# Patient Record
Sex: Male | Born: 1995
Health system: Southern US, Community
[De-identification: ages and names within clinical notes are randomized; demographics above are authoritative.]

---
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
## 2013-08-18 ENCOUNTER — Ambulatory Visit (INDEPENDENT_AMBULATORY_CARE_PROVIDER_SITE_OTHER): Payer: Federal, State, Local not specified - PPO | Admitting: Internal Medicine

## 2013-08-18 VITALS — BP 124/76 | HR 94 | Temp 98.5°F | Resp 17 | Ht 63.5 in | Wt 155.0 lb

## 2013-08-18 DIAGNOSIS — L259 Unspecified contact dermatitis, unspecified cause: Secondary | ICD-10-CM

## 2013-08-18 DIAGNOSIS — R51 Headache: Secondary | ICD-10-CM

## 2013-08-18 MED ORDER — BUTALBITAL-APAP-CAFFEINE 50-325-40 MG PO TABS: 1.0000 | ORAL_TABLET | Freq: Four times a day (QID) | ORAL | Status: DC | PRN

## 2013-08-18 MED ORDER — MELOXICAM 15 MG PO TABS: 15.0000 mg | ORAL_TABLET | Freq: Every day | ORAL | Status: DC

## 2013-08-18 MED ORDER — FLUOCINONIDE 0.05 % EX CREA: 1.0000 "application " | TOPICAL_CREAM | Freq: Two times a day (BID) | CUTANEOUS | Status: DC

## 2013-08-18 NOTE — Progress Notes (Signed)
Subjective:    Patient ID: Adam BanisterHarrison C Amis, male    DOB: 30-Sep-1995, 18 y.o.   MRN: [id number]  HPI Patient presents with a one day history of headache described as a low pitched rumbling, going from the back of his head into his neck, worsened with noise and motion and pressure behind his eyes worse with movement and light. He reports that yesterday he helped his Mom with errands and was not able to eat breakfast and the headache started soon afterwards.  He reports he usually eats a large breakfast. Headache has continued but did not interfere with sleep. He was able to go out with friends last night but felt a worsening of the headache sitting in a movie. He denies any recent illnesses, previous occurences of this type of headache, family history of migraines.  He denies chills, sweats, double vision, eye discharge, vision changes, sinus pain/pressure, nausea, vomiting, cough.  He reports mild nasal drainage, congestion, post nasal drip.     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   Additionally, he reports a rash to the abdomen that he had previously that was treated with a cream and has now returned.  He denies itching.  He reports using the cream for about one week after the rash resolved.     Review of Systems  Constitutional: Negative for fever, chills, activity change, appetite change and fatigue.  HENT: Positive for congestion, postnasal drip and rhinorrhea. Negative for ear pain, hearing loss, sinus pressure, sneezing, sore throat and tinnitus.   Eyes: Positive for photophobia and pain (pressure deep behind bilateral eyes). Negative for discharge, redness, itching and visual disturbance.  Respiratory: Negative for cough and shortness of breath.   Gastrointestinal: Negative for nausea, vomiting and diarrhea.  Musculoskeletal:  Negative for neck pain and neck stiffness.  Skin: Positive for rash (abdomen for about one week).  Neurological: Positive for headaches (occiput into the neck). Negative for dizziness and light-headedness.      Objective:   Physical Exam  Constitutional: He is oriented to person, place, and time. He appears well-developed and well-nourished. No distress.  HENT:  Head: Normocephalic and atraumatic.  Right Ear: Tympanic membrane and external ear normal. No decreased hearing is noted.  Left Ear: Tympanic membrane and external ear normal. No decreased hearing is noted.  Nose: Nose normal. Right sinus exhibits no maxillary sinus tenderness and no frontal sinus tenderness. Left sinus exhibits no maxillary sinus tenderness and no frontal sinus tenderness.  Mouth/Throat: Oropharynx is clear and moist.  Eyes: Conjunctivae and EOM are normal. Pupils are equal, round, and reactive to light. Right eye exhibits no exudate. Left eye exhibits no exudate. No scleral icterus.  Light sensitivity  Neck: Normal range of motion. Neck supple. No muscular tenderness present. No thyromegaly present.  Cardiovascular: Normal rate, regular rhythm and normal heart sounds.   No murmur heard. Pulmonary/Chest: Effort normal and breath sounds normal. No respiratory distress.  Musculoskeletal: He exhibits no edema.  Lymphadenopathy:       Head (right side): No submandibular, no tonsillar, no preauricular, no posterior auricular and no occipital adenopathy present.       Head (left side): No submandibular, no tonsillar, no preauricular, no posterior auricular and no occipital adenopathy present.    He has no cervical adenopathy.  Neurological: He is alert and  oriented to person, place, and time. He has normal reflexes. No cranial nerve deficit. Coordination normal.  Skin: Skin is warm and dry.     Rash previously treated with nystatin/triamcinolone  Psychiatric: He has a normal mood and affect. His behavior is normal.  Thought content normal.      Assessment & Plan:   I have completed the patient encounter in its entirety as documented by the FNP-S, with editing by me where necessary. Samrat Hayward P. Merla Riches, M.D.  Headache(784.0)--unclear etiology-? Early viral. /? Muscle contraction headache  Contact dermatitis-belt buckle  Meds ordered this encounter  Medications  . fluocinonide cream (LIDEX) 0.05 %    Sig: Apply 1 application topically 2 (two) times daily.    Dispense:  30 g    Refill:  0  . meloxicam (MOBIC) 15 MG tablet    Sig: Take 1 tablet (15 mg total) by mouth daily.    Dispense:  10 tablet    Refill:  0  . butalbital-acetaminophen-caffeine (FIORICET) 50-325-40 MG per tablet    Sig: Take 1-2 tablets by mouth every 6 (six) hours as needed for headache.    Dispense:  15 tablet    Refill:  0   F/u 1 week if not well/or f-u at [facility]

## 2014-01-10 ENCOUNTER — Ambulatory Visit (INDEPENDENT_AMBULATORY_CARE_PROVIDER_SITE_OTHER): Payer: Federal, State, Local not specified - PPO | Admitting: Family Medicine

## 2014-01-10 VITALS — BP 116/72 | HR 91 | Temp 98.3°F | Resp 16 | Ht 63.75 in | Wt 167.2 lb

## 2014-01-10 DIAGNOSIS — R040 Epistaxis: Secondary | ICD-10-CM

## 2014-01-10 LAB — POCT CBC
Granulocyte percent: 58.3 %G (ref 37–80)
HEMATOCRIT: 45.5 % (ref 43.5–53.7)
HEMOGLOBIN: 15 g/dL (ref 14.1–18.1)
Lymph, poc: 2.3 (ref 0.6–3.4)
MCH, POC: 28.7 pg (ref 27–31.2)
MCHC: 33.1 g/dL (ref 31.8–35.4)
MCV: 86.6 fL (ref 80–97)
MID (cbc): 0.4 (ref 0–0.9)
MPV: 7.7 fL (ref 0–99.8)
POC GRANULOCYTE: 3.7 (ref 2–6.9)
POC LYMPH PERCENT: 36.1 %L (ref 10–50)
POC MID %: 5.6 % (ref 0–12)
Platelet Count, POC: 293 10*3/uL (ref 142–424)
RBC: 5.25 M/uL (ref 4.69–6.13)
RDW, POC: 12.8 %
WBC: 6.4 10*3/uL (ref 4.6–10.2)

## 2014-01-10 MED ORDER — MAGIC MOUTHWASH W/LIDOCAINE: 10.0000 mL | ORAL | Status: DC | PRN

## 2014-01-10 MED ORDER — OXYMETAZOLINE HCL 0.05 % NA SOLN: 3.0000 | NASAL | Status: DC | PRN

## 2014-01-10 NOTE — Patient Instructions (Signed)
Nosebleed Nosebleeds can be caused by many conditions, including trauma, infections, polyps, foreign bodies, dry mucous membranes or climate, medicines, and air conditioning. Most nosebleeds occur in the front of the nose. Because of this location, most nosebleeds can be controlled by pinching the nostrils gently and continuously for at least 10 to 20 minutes. The long, continuous pressure allows enough time for the blood to clot. If pressure is released during that 10 to 20 minute time period, the process may have to be started again. The nosebleed may stop by itself or quit with pressure, or it may need concentrated heating (cautery) or pressure from packing. HOME CARE INSTRUCTIONS   If your nose was packed, try to maintain the pack inside until your health care provider removes it. If a gauze pack was used and it starts to fall out, gently replace it or cut the end off. Do not cut if a balloon catheter was used to pack the nose. Otherwise, do not remove unless instructed.  Avoid blowing your nose for 12 hours after treatment. This could dislodge the pack or clot and start the bleeding again.  If the bleeding starts again, sit up and bend forward, gently pinching the front half of your nose continuously for 20 minutes.  If bleeding was caused by dry mucous membranes, use over-the-counter saline nasal spray or gel. This will keep the mucous membranes moist and allow them to heal. If you must use a lubricant, choose the water-soluble variety. Use it only sparingly and not within several hours of lying down.  Do not use petroleum jelly or mineral oil, as these may drip into the lungs and cause serious problems.  Maintain humidity in your home by using less air conditioning or by using a humidifier.  Do not use aspirin or medicines which make bleeding more likely. Your health care provider can give you recommendations on this.  Resume normal activities as you are able, but try to avoid straining,  lifting, or bending at the waist for several days.  If the nosebleeds become recurrent and the cause is unknown, your health care provider may suggest laboratory tests. SEEK MEDICAL CARE IF: You have a fever. SEEK IMMEDIATE MEDICAL CARE IF:   Bleeding recurs and cannot be controlled.  There is unusual bleeding from or bruising on other parts of the body.  Nosebleeds continue.  There is any worsening of the condition which originally brought you in.  You become light-headed, feel faint, become sweaty, or vomit blood. MAKE SURE YOU:   Understand these instructions.  Will watch your condition.  Will get help right away if you are not doing well or get worse. Document Released: 03/04/2005 Document Revised: 10/09/2013 Document Reviewed: 04/25/2009 [facility] Patient Information 2015 [place], [place]. This information is not intended to replace advice given to you by your health care provider. Make sure you discuss any questions you have with your health care provider.  Swallowed Foreign Body, Adult You have swallowed an object (foreign body). Once the foreign body has passed through the food tube (esophagus), which leads from the mouth to the stomach, it will usually continue through the body without problems. This is because the point where the esophagus enters into the stomach is the narrowest place through which the foreign body must pass. Sometimes the foreign body gets stuck. The most common type of foreign body obstruction in adults is food impaction. Many times, bones from fish or meat products may become lodged in the esophagus or injure the throat on  the way down. When there is an object that obstructs the esophagus, the most obvious symptoms are pain and the inability to swallow normally. In some cases, foreign bodies that can be life threatening are swallowed. Examples of these are certain medications and illicit drugs. Often in these instances, patients are afraid of telling what they  swallowed. However, it is extremely important to tell the emergency caregiver what was swallowed because life-saving treatment may be needed.  X-ray exams may be taken to find the location of the foreign body. However, some objects do not show up well or may be too small to be seen on an X-ray image. If the foreign body is too large or too sharp, it may be too dangerous to allow it to pass on its own. You may need to see a caregiver who specializes in the digestive system (gastroenterologist). In a few cases, a specialist may need to remove the object using a method called "endoscopy". This involves passing a thin, soft, flexible tube into the food pipe to locate and remove the object. Follow up with your primary doctor or the referral you were given by the emergency caregiver. HOME CARE INSTRUCTIONS   If your caregiver says it is safe for you to eat, then only have liquids and soft foods until your symptoms improve.  Once you are eating normally:  Cut food into small pieces.  Remove small bones from food.  Remove large seeds and pits from fruit.  Chew your food well.  Do not talk, laugh, or engage in physical activity while eating or swallowing. SEEK MEDICAL CARE IF:  You develop worsening shortness of breath, uncontrollable coughing, chest pains or high fever, greater than 102 F (38.9 C).  You are unable to eat or drink or you feel that food is getting stuck in your throat.  You have choking symptoms or cannot stop drooling.  You develop abdominal pain, vomiting (especially of blood), or rectal bleeding. MAKE SURE YOU:   Understand these instructions.  Will watch your condition.  Will get help right away if you are not doing well or get worse. Document Released: 11/12/2009 Document Revised: 08/17/2011 Document Reviewed: 11/12/2009 [facility] Patient Information 2015 [place], [place]. This information is not intended to replace advice given to you by your health care provider. Make  sure you discuss any questions you have with your health care provider.

## 2014-01-10 NOTE — Progress Notes (Signed)
This chart was scribed for Norberto SorensonEva Shaw, MD by Luisa DagoPriscilla Tutu, ED Scribe. This patient was seen in room 11 and the patient's care was started at 9:10 PM.  Subjective:    Patient ID: Adam BanisterHarrison C Slight, male    DOB: 06/30/1995, 18 y.o.   MRN: [id number] Chief Complaint  Patient presents with  . Epistaxis    for about 3 hours this morning.  made him feel nauseated today.  sharp pain in his throat now and can feel this in his right ear.  pt also stated that about 3 wks ago he was having chest pain sometimes with heartburn.      HPI Adam Murphy is a 18 y.o. male coming into the office with his mother complaining of a severe episode of epistaxis that lasted for approximately 3 hours. Pt states that he tried to stop the blood by applying multiple paper towels into his left nostril but whenever he took the tissue out the blood "gushed out". He states that his last episode of epistaxis was in March during a short illness. Adam Murphy also experienced some associated nausea and throat pain after drinking a smoothie. He states that the pain in his throat is causing him to experienced some mild hoarseness. Pt states that the pain is more localized to the right side of his throat. He describes the pain as "sharp" in nature. Denies any congestion, fever, chills, or usage of nasal sprays.  Pt states that 2 weeks ago he had an episode or indigestion. So he is concerned that this episode of sore throat may be due to the indigestion. He states that he experienced associated chest tightness. However, during that time there was a lot of family issues going on secondary to a death in the family. He denies taking any medication. Denies any metallic taste in his mouth upon waking. However, he does state that usually upon waking his throat feels really dry and he has to drink a lot of water in order to alleviate that dryness.   There are no active problems to display for this patient.  No past medical history on  file. No past surgical history on file. Allergies  Allergen Reactions  . Penicillins     hives   Prior to Admission medications   Not on File   History   Social History  . Marital Status: Single    Spouse Name: N/A    Number of Children: N/A  . Years of Education: N/A   Occupational History  . Not on file.   Social History Main Topics  . Smoking status: Never Smoker   . Smokeless tobacco: Not on file  . Alcohol Use: No  . Drug Use: No  . Sexual Activity: No   Other Topics Concern  . Not on file   Social History Narrative  . No narrative on file    Review of Systems  Constitutional: Negative for fatigue and unexpected weight change.  HENT: Positive for nosebleeds. Negative for congestion.   Eyes: Negative for visual disturbance.  Respiratory: Negative for cough, chest tightness and shortness of breath.   Cardiovascular: Negative for chest pain, palpitations and leg swelling.  Gastrointestinal: Positive for nausea. Negative for abdominal pain and blood in stool.  Neurological: Negative for dizziness, light-headedness and headaches.    Vital Signs: BP 116/72  Pulse 91  Temp(Src) 98.3 F (36.8 C) (Oral)  Resp 16  Ht 5' 3.75" (1.619 m)  Wt 167 lb 3.2 oz (75.841 kg)  BMI 28.93 kg/m2  SpO2 99%  Objective:   Physical Exam  Nursing note and vitals reviewed. Constitutional: He is oriented to person, place, and time. He appears well-developed and well-nourished. No distress.  HENT:  Head: Normocephalic and atraumatic.  Nose: Nasal septal hematoma present.  Mild nasal congestion noted.  Eyes: Conjunctivae and EOM are normal. Pupils are equal, round, and reactive to light.  Neck: Normal range of motion. Neck supple. No thyromegaly present.  Cardiovascular: Normal rate.   Pulmonary/Chest: Effort normal. No respiratory distress.  Musculoskeletal: Normal range of motion.  Lymphadenopathy:    He has no cervical adenopathy.    He has no axillary adenopathy.   Neurological: He is alert and oriented to person, place, and time.  Skin: Skin is warm and dry.  Psychiatric: He has a normal mood and affect. His behavior is normal.      Results for orders placed in visit on 01/10/14  POCT CBC      Result Value Ref Range   WBC 6.4  4.6 - 10.2 K/uL   Lymph, poc 2.3  0.6 - 3.4   POC LYMPH PERCENT 36.1  10 - 50 %L   MID (cbc) 0.4  0 - 0.9   POC MID % 5.6  0 - 12 %M   POC Granulocyte 3.7  2 - 6.9   Granulocyte percent 58.3  37 - 80 %G   RBC 5.25  4.69 - 6.13 M/uL   Hemoglobin 15.0  14.1 - 18.1 g/dL   HCT, POC 57.8  46.9 - 53.7 %   MCV 86.6  80 - 97 fL   MCH, POC 28.7  27 - 31.2 pg   MCHC 33.1  31.8 - 35.4 g/dL   RDW, POC 62.9     Platelet Count, POC 293  142 - 424 K/uL   MPV 7.7  0 - 99.8 fL     Assessment & Plan:   Epistaxis - Plan: POCT CBC  Meds ordered this encounter  Medications  . oxymetazoline (AFRIN NASAL SPRAY) 0.05 % nasal spray    Sig: Place 3 sprays into left nostril every 5 (five) minutes as needed for congestion.    Dispense:  30 mL    Refill:  0  . Alum & Mag Hydroxide-Simeth (MAGIC MOUTHWASH W/LIDOCAINE) SOLN    Sig: Take 10 mLs by mouth every 2 (two) hours as needed for mouth pain. Swish in mouth and swallow    Dispense:  360 mL    Refill:  0    Ok to use pharmacy formulary and mix 1:1 with viscous lidocaine    I personally performed the services described in this documentation, which was scribed in my presence. The recorded information has been reviewed and considered, and addended by me as needed.  Norberto Sorenson, MD MPH

## 2014-01-12 ENCOUNTER — Encounter (HOSPITAL_COMMUNITY): Payer: Self-pay | Admitting: Emergency Medicine

## 2014-01-12 ENCOUNTER — Emergency Department (HOSPITAL_COMMUNITY)
Admission: EM | Admit: 2014-01-12 | Discharge: 2014-01-12 | Disposition: A | Discharge status: Home / Self Care | Payer: Federal, State, Local not specified - PPO | Attending: Emergency Medicine | Admitting: Emergency Medicine

## 2014-01-12 DIAGNOSIS — Z880 Allergy status to penicillin: Secondary | ICD-10-CM | POA: Insufficient documentation

## 2014-01-12 DIAGNOSIS — R209 Unspecified disturbances of skin sensation: Secondary | ICD-10-CM | POA: Insufficient documentation

## 2014-01-12 DIAGNOSIS — R6889 Other general symptoms and signs: Secondary | ICD-10-CM | POA: Insufficient documentation

## 2014-01-12 DIAGNOSIS — T50905A Adverse effect of unspecified drugs, medicaments and biological substances, initial encounter: Secondary | ICD-10-CM

## 2014-01-12 DIAGNOSIS — T413X5A Adverse effect of local anesthetics, initial encounter: Secondary | ICD-10-CM | POA: Insufficient documentation

## 2014-01-12 NOTE — ED Notes (Signed)
Patient here with complaint of allergic reaction to medication prescribed at urgent care. States that he was having some pain in his throat which prompted him to go to urgent care. States his throat began to feel numb, started to burn, then it became harder to breath. States that symptoms have resolved some at this time. Inspection of throat reveals blood shot inflamed tissue, but airway doesn't appear to be swelling. Patient denies shortness of breath.

## 2014-01-12 NOTE — ED Provider Notes (Signed)
CSN: [id number]     Arrival date & time 01/12/14  0101 History   First MD Initiated Contact with Patient 01/12/14 0325     Chief Complaint  Patient presents with  . Allergic Reaction     (Consider location/radiation/quality/duration/timing/severity/associated sxs/prior Treatment) HPI 18 year old male had gone to an urgent care just PTA after drinking a smoothie and began feeling a scratchy throat, the urgent care prescribed viscous lidocaine and when the patient took it tonight at home prior to arrival the patient felt numbness to his tongue and numbness to his throat and had no idea the medication was going to make him, so he thought it was an allergic reaction and came to the ED for evaluation, his symptoms are now resolved. He does not feel the need to use viscous lidocaine again.  OP minimal erythema  History reviewed. No pertinent past medical history. History reviewed. No pertinent past surgical history. Family History  Problem Relation Age of Onset  . Diabetes Paternal Grandmother    History  Substance Use Topics  . Smoking status: Never Smoker   . Smokeless tobacco: Not on file  . Alcohol Use: No    Review of Systems 10 Systems reviewed and are negative for acute change except as noted in the HPI.   Allergies  Penicillins  Home Medications   Prior to Admission medications   Medication Sig Start Date End Date Taking? Authorizing Provider  Alum & Mag Hydroxide-Simeth (MAGIC MOUTHWASH W/LIDOCAINE) SOLN Take 10 mLs by mouth every 2 (two) hours as needed for mouth pain. Swish in mouth and swallow 01/10/14  Yes Sherren MochaEva N Shaw, MD   BP 118/74  Pulse 92  Temp(Src) 98.4 F (36.9 C) (Oral)  Resp 16  SpO2 100% Physical Exam  Nursing note and vitals reviewed. Constitutional:  Awake, alert, nontoxic appearance.  HENT:  Head: Atraumatic.  Mouth/Throat: No oropharyngeal exudate.  Minimal erythema posterior pharynx no swelling; uvula midline; no trismus  Eyes: Right eye  exhibits no discharge. Left eye exhibits no discharge.  Neck: Neck supple.  Cardiovascular: Normal rate and regular rhythm.   No murmur heard. Pulmonary/Chest: Effort normal and breath sounds normal. No respiratory distress. He has no wheezes. He has no rales. He exhibits no tenderness.  RA sat normal 100%  Abdominal: Soft. There is no tenderness. There is no rebound.  Musculoskeletal: He exhibits no tenderness.  Baseline ROM, no obvious new focal weakness.  Lymphadenopathy:    He has no cervical adenopathy.  Neurological: He is alert.  Mental status and motor strength appears baseline for patient and situation.  Skin: No rash noted.  Psychiatric: He has a normal mood and affect.    ED Course  Procedures (including critical care time) Labs Review Labs Reviewed - No data to display  Imaging Review No results found.   EKG Interpretation None      MDM   Final diagnoses:  Medication side effect, initial encounter    Patient / Family / Caregiver informed of clinical course, understand medical decision-making process, and agree with plan.  I doubt any other EMC precluding discharge at this time including, but not necessarily limited to the following:allergic reaction.    Hurman HornJohn M Richa Shor, MD 01/13/14 [phone]

## 2014-01-12 NOTE — Discharge Instructions (Signed)
°  RETURN IMMEDIATELY IF you develop inability to swallow, drooling, tongue swelling, lip swelling, shortness of breath, confusion or altered mental status, a new rash, become dizzy, faint, or poorly responsive, or are unable to be cared for at home.

## 2014-12-19 ENCOUNTER — Ambulatory Visit
Admission: RE | Admit: 2014-12-19 | Discharge: 2014-12-19 | Disposition: A | Discharge status: Home / Self Care | Payer: Federal, State, Local not specified - PPO | Source: Ambulatory Visit | Attending: Physician Assistant | Admitting: Physician Assistant

## 2014-12-19 ENCOUNTER — Other Ambulatory Visit: Payer: Self-pay | Admitting: Physician Assistant

## 2014-12-19 DIAGNOSIS — R0989 Other specified symptoms and signs involving the circulatory and respiratory systems: Secondary | ICD-10-CM

## 2015-05-24 ENCOUNTER — Ambulatory Visit (INDEPENDENT_AMBULATORY_CARE_PROVIDER_SITE_OTHER): Payer: Federal, State, Local not specified - PPO | Admitting: Family Medicine

## 2015-05-24 VITALS — BP 104/70 | HR 89 | Temp 98.4°F | Resp 16 | Ht 64.75 in | Wt 172.0 lb

## 2015-05-24 DIAGNOSIS — H6982 Other specified disorders of Eustachian tube, left ear: Secondary | ICD-10-CM | POA: Diagnosis not present

## 2015-05-24 DIAGNOSIS — J0110 Acute frontal sinusitis, unspecified: Secondary | ICD-10-CM

## 2015-05-24 MED ORDER — CEFDINIR 300 MG PO CAPS: 600.0000 mg | ORAL_CAPSULE | Freq: Every day | ORAL | Status: DC

## 2015-05-24 NOTE — Progress Notes (Signed)
Patient ID: Adam Murphy, male    DOB: 1995-09-22  Age: 19 y.o. MRN: [id number]  Chief Complaint  Patient presents with  . Ear Pain    X 2 weeks, left side  . Sore Throat    X 2 weeks, left side  . Headache    X 2 weeks, left side    Subjective:   19 year old male who is here with history of having some symptoms over the past couple weeks. He has had some left-sided hand and frontal pain, stuffiness in his left ear with some discomfort down into the left side of the throat and neck. He has left without finishing up the semester at school, but now that is home decided to come on and get checked. He does have some Flonase, but he has not used it regularly. He does not smoke. He is otherwise a generally healthy person. Not on other regular medications. He has not been febrile to his knowledge.  He is a Consulting civil engineerstudent at [employer], Holiday representativeenior, Environmental managerphysics with math and sciences.    Current allergies, medications, problem list, past/family and social histories reviewed.  Objective:  BP 104/70 mmHg  Pulse 89  Temp(Src) 98.4 F (36.9 C) (Oral)  Resp 16  Ht 5' 4.75" (1.645 m)  Wt 172 lb (78.019 kg)  BMI 28.83 kg/m2  SpO2 97%  Healthy-appearing young man in no acute distress. He is TMs are dull, more so on the left with some exudate appearance on the left TM but the drum is not very red. He has throat is clear. Neck supple without major nodes. Chest is clear to auscultation. Heart regular without murmur. He has some tenderness over the left frontal and maxillary regions.  Assessment & Plan:   Assessment: 1. Acute frontal sinusitis, recurrence not specified   2. Eustachian tube dysfunction, left       Plan: This probably represents a low-grade sinusitis, with secondary eustachian tube dysfunction and maybe an early otitis. Will go ahead and treat, no labs indicated today. Return if not improving  No orders of the defined types were placed in this encounter.    Meds ordered  this encounter  Medications  . cefdinir (OMNICEF) 300 MG capsule    Sig: Take 2 capsules (600 mg total) by mouth daily.    Dispense:  20 capsule    Refill:  0         Patient Instructions  Drink plenty of fluids and get enough rest  Take Omnicef (cefdinir) 30 mg 1 pill twice daily  Use the Flonase (fluticasone) nose spray 2 sprays each nostril twice daily for about 3 or 4 days, then decrease to once daily  Take over-the-counter Claritin-D (loratadine D) to try and open up the sinuses and eustachian tubes  Return if not improving  Tylenol 500 mg 2 pills 3 times daily or ibuprofen 600-800 mg 3 times daily for headache and neck pain as needed.     Return if symptoms worsen or fail to improve.   HOPPER,DAVID, MD 05/24/2015

## 2015-05-24 NOTE — Patient Instructions (Signed)
Drink plenty of fluids and get enough rest  Take Omnicef (cefdinir) 30 mg 1 pill twice daily  Use the Flonase (fluticasone) nose spray 2 sprays each nostril twice daily for about 3 or 4 days, then decrease to once daily  Take over-the-counter Claritin-D (loratadine D) to try and open up the sinuses and eustachian tubes  Return if not improving  Tylenol 500 mg 2 pills 3 times daily or ibuprofen 600-800 mg 3 times daily for headache and neck pain as needed.

## 2015-10-26 ENCOUNTER — Ambulatory Visit: Payer: Federal, State, Local not specified - PPO | Admitting: Family Medicine

## 2015-10-26 VITALS — BP 104/62 | HR 80 | Temp 97.6°F | Resp 16 | Ht 64.0 in | Wt 174.2 lb

## 2015-10-26 DIAGNOSIS — R369 Urethral discharge, unspecified: Secondary | ICD-10-CM

## 2015-10-26 MED ORDER — AZITHROMYCIN 250 MG PO TABS: 1000.0000 mg | ORAL_TABLET | Freq: Once | ORAL | Status: DC

## 2015-10-26 MED ORDER — CEFTRIAXONE SODIUM 1 G IJ SOLR
250.0000 mg | Freq: Once | INTRAMUSCULAR | Status: AC
  Administered 2015-10-26: 250 mg via INTRAMUSCULAR

## 2015-10-26 NOTE — Progress Notes (Signed)
Adam Murphy is a 20 y.o. male who presents to Urgent Care today for penile discharge. Patient has a 2 to three-day history of penile discharge. This is without pain or irritation. It is not consistent with previous episodes of gonorrhea. Otherwise she feels well. He has sex with men. He practices safe sex and gets regular testing for HIV. His last test was less than one month ago. He uses condoms exclusively during sex.   History reviewed. No pertinent past medical history. History reviewed. No pertinent past surgical history. Social History  Substance Use Topics  . Smoking status: Never Smoker   . Smokeless tobacco: Not on file  . Alcohol Use: No   ROS as above Medications: Current Outpatient Prescriptions  Medication Sig Dispense Refill  . azithromycin (ZITHROMAX) 250 MG tablet Take 4 tablets (1,000 mg total) by mouth once. 1 6 each 0   Current Facility-Administered Medications  Medication Dose Route Frequency Provider Last Rate Last Dose  . cefTRIAXone (ROCEPHIN) injection 250 mg  250 mg Intramuscular Once Rodolph BongEvan S Glennda Weatherholtz, MD       Allergies  Allergen Reactions  . Penicillins     hives  . Nickel Rash     Exam:  BP 104/62 mmHg  Pulse 80  Temp(Src) 97.6 F (36.4 C) (Oral)  Resp 16  Ht 5\' 4"  (1.626 m)  Wt 174 lb 3.2 oz (79.017 kg)  BMI 29.89 kg/m2  SpO2 99% Gen: Well NAD HEENT: EOMI,  MMM Lungs: Normal work of breathing. CTABL Heart: RRR no MRG Abd: NABS, Soft. Nondistended, Nontender Exts: Brisk capillary refill, warm and well perfused.  Genitals: No inguinal lymphadenopathy. Penis is circumcised without significant discharge or lesions. Testicles distended bilaterally without masses or tenderness. MSK: Normal muscle motion and function throughout.  Ceftriaxone 250 mg given IM prior to discharge.  No results found for this or any previous visit (from the past 24 hour(s)). No results found.  Assessment and Plan: 20 y.o. male with penile discharge: Unclear  etiology. GC chlamydia pending. Treatment with ceftriaxone IM in clinic and a prescription for azithromycin provided. Continue routine HIV testing  Discussed warning signs or symptoms. Please see discharge instructions. Patient expresses understanding.

## 2015-10-26 NOTE — Patient Instructions (Addendum)
  Thank you for coming in today. Take the azithromycin pills today.  We will call you with results.    Dr Denyse Amassorey.   Address: [address], [place], [place] [address]   Phone: [phone]  Call and establish care for PrEP Return as needed.      IF you received an x-ray today, you will receive an invoice from [facility]. Please contact [facility] at [phone] with questions or concerns regarding your invoice.   IF you received labwork today, you will receive an invoice from [facility]. Please contact [facility] at [phone] with questions or concerns regarding your invoice.   Our billing staff will not be able to assist you with questions regarding bills from these companies.  You will be contacted with the lab results as soon as they are available. The fastest way to get your results is to activate your My Chart account. Instructions are located on the last page of this paperwork. If you have not heard from us regarding the results in 2 weeks, please contact this office.

## 2015-10-28 LAB — GC/CHLAMYDIA PROBE AMP
CT Probe RNA: NOT DETECTED
GC PROBE AMP APTIMA: NOT DETECTED

## 2015-12-26 ENCOUNTER — Other Ambulatory Visit: Payer: BLUE CROSS/BLUE SHIELD

## 2015-12-26 ENCOUNTER — Ambulatory Visit: Payer: Federal, State, Local not specified - PPO | Admitting: Internal Medicine

## 2015-12-26 ENCOUNTER — Ambulatory Visit (INDEPENDENT_AMBULATORY_CARE_PROVIDER_SITE_OTHER): Payer: BLUE CROSS/BLUE SHIELD | Admitting: Pharmacist Clinician (PhC)/ Clinical Pharmacy Specialist

## 2015-12-26 DIAGNOSIS — Z7251 High risk heterosexual behavior: Secondary | ICD-10-CM

## 2015-12-26 NOTE — Progress Notes (Signed)
Patient ID: Adam Murphy, male   DOB: 1995/09/19, 20 y.o.   MRN: [id number] HPI: Adam Murphy is a 20 y.o. male who was referred to [place] for HIV PreP.   Allergies: Allergies  Allergen Reactions  . Penicillins     hives  . Nickel Rash    Vitals:    Past Medical History: No past medical history on file.  Social History: Social History   Social History  . Marital Status: Single    Spouse Name: N/A  . Number of Children: N/A  . Years of Education: N/A   Social History Main Topics  . Smoking status: Never Smoker   . Smokeless tobacco: Not on file  . Alcohol Use: No  . Drug Use: No  . Sexual Activity: No   Other Topics Concern  . Not on file   Social History Narrative    Previous Regimen:   Current Regimen: None  Labs: No results found for: HIV1RNAQUANT, HIV1RNAVL, CD4TABS, HEPBSAB, HEPBSAG, HCVAB  CrCl: CrCl cannot be calculated (Unknown ideal weight.).  Lipids: No results found for: CHOL, TRIG, HDL, CHOLHDL, VLDL, LDLCALC  Assessment: Adam Murphy was added on to the schedule per Dr. Megan Salon to be seen for PreP. He presented here with his partner who is HIV positive and has been on therapy for the last yr and a half. Adam Murphy has had about 6 partners in the last 6 months. He would likely be qualified for the Prep study. Adam Murphy met with him to explain the process. He does travel for work sometimes but he may be able to manage around it for the study. His partner encourage him to enroll into the study. Since he is here we are going to get all baseline test today. Once the tests are back, he can decide to either starting PreP or enroll into the study.   Recommendations:  Baseline test for PreP (HIV, hep a/b/c, bmet, STDs)  Wilfred Lacy, PharmD Clinical Infectious [facility] 12/26/2015, 5:10 PM

## 2015-12-27 LAB — HEPATITIS B SURFACE ANTIBODY,QUALITATIVE: HEP B S AB: NEGATIVE

## 2015-12-27 LAB — BASIC METABOLIC PANEL
BUN: 10 mg/dL (ref 7–25)
CALCIUM: 9.1 mg/dL (ref 8.6–10.3)
CO2: 26 mmol/L (ref 20–31)
CREATININE: 1.17 mg/dL (ref 0.60–1.35)
Chloride: 105 mmol/L (ref 98–110)
Glucose, Bld: 85 mg/dL (ref 65–99)
Potassium: 4.4 mmol/L (ref 3.5–5.3)
Sodium: 141 mmol/L (ref 135–146)

## 2015-12-27 LAB — HEPATITIS A ANTIBODY, TOTAL: Hep A Total Ab: REACTIVE — AB

## 2015-12-27 LAB — RPR

## 2015-12-27 LAB — HEPATITIS B SURFACE ANTIGEN: HEP B S AG: NEGATIVE

## 2015-12-27 LAB — HEPATITIS C ANTIBODY: HCV Ab: NEGATIVE

## 2015-12-27 LAB — HIV ANTIBODY (ROUTINE TESTING W REFLEX): HIV 1&2 Ab, 4th Generation: NONREACTIVE

## 2015-12-30 LAB — URINE CYTOLOGY ANCILLARY ONLY
CHLAMYDIA, DNA PROBE: NEGATIVE
Neisseria Gonorrhea: NEGATIVE

## 2015-12-30 LAB — CYTOLOGY, (ORAL, ANAL, URETHRAL) ANCILLARY ONLY
Chlamydia: NEGATIVE
Chlamydia: NEGATIVE
NEISSERIA GONORRHEA: NEGATIVE
Neisseria Gonorrhea: NEGATIVE

## 2016-01-14 ENCOUNTER — Ambulatory Visit: Payer: Federal, State, Local not specified - PPO | Admitting: Internal Medicine

## 2016-01-20 ENCOUNTER — Telehealth: Payer: Self-pay | Admitting: Pharmacist Clinician (PhC)/ Clinical Pharmacy Specialist

## 2016-01-20 DIAGNOSIS — Z7251 High risk heterosexual behavior: Secondary | ICD-10-CM

## 2016-01-20 MED ORDER — EMTRICITABINE-TENOFOVIR DF 200-300 MG PO TABS: 1.0000 | ORAL_TABLET | Freq: Every day | ORAL | 0 refills | Status: DC

## 2016-01-20 MED FILL — TRUVADA 200-300 MG TABS: 200-300 | 30 days supply | Qty: 30 | Fill #0

## 2016-01-20 NOTE — Telephone Encounter (Signed)
Called Adam Murphy back to tell him that we are going to send is rx for 1 mo supply of TRV for PreP. [facility]. He is interviewing for a new job tomorrow and was wondering if he'd still be qualify for the study. Told him that he could still be enrolled into the study. Appts are made for HIV ab in 1 mo and f/u with pharmacy 1 week later for 3 mo supply.

## 2016-01-20 NOTE — Telephone Encounter (Signed)
Yes he would definitely qualify for the study. The only thing that would take him out would be if his risk changed by the time he rescreened for study. Being on Truvada BEFORE the study. He would have to stop it and only take study meds ONCE he goes iin the study

## 2016-01-20 NOTE — Telephone Encounter (Signed)
Error

## 2016-01-24 IMAGING — CR DG CHEST 2V
2 series · 2 of 2 positions shown · non-contrast
Comparison: None.

CLINICAL DATA: Chest congestion and shortness of breath for the
last month

EXAM:
CHEST  2 VIEW

[view not recorded (1 of 2)]
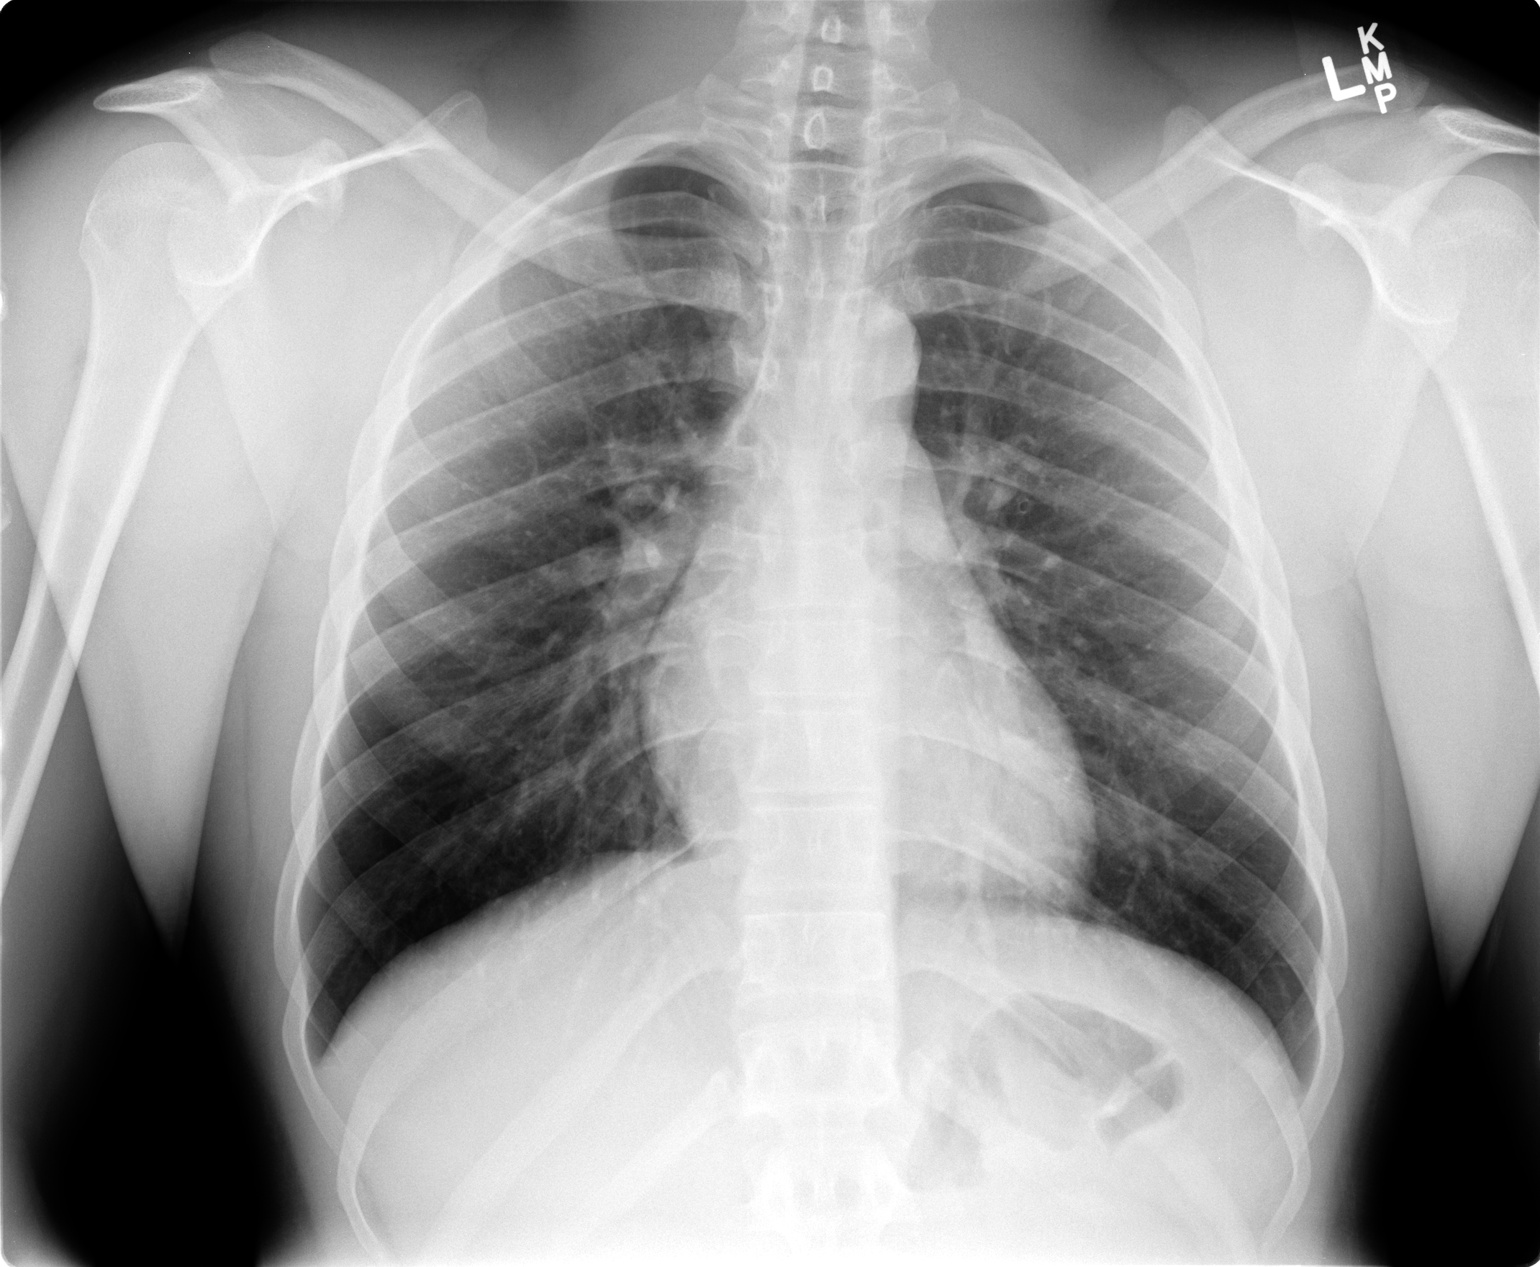

[view not recorded (2 of 2)]
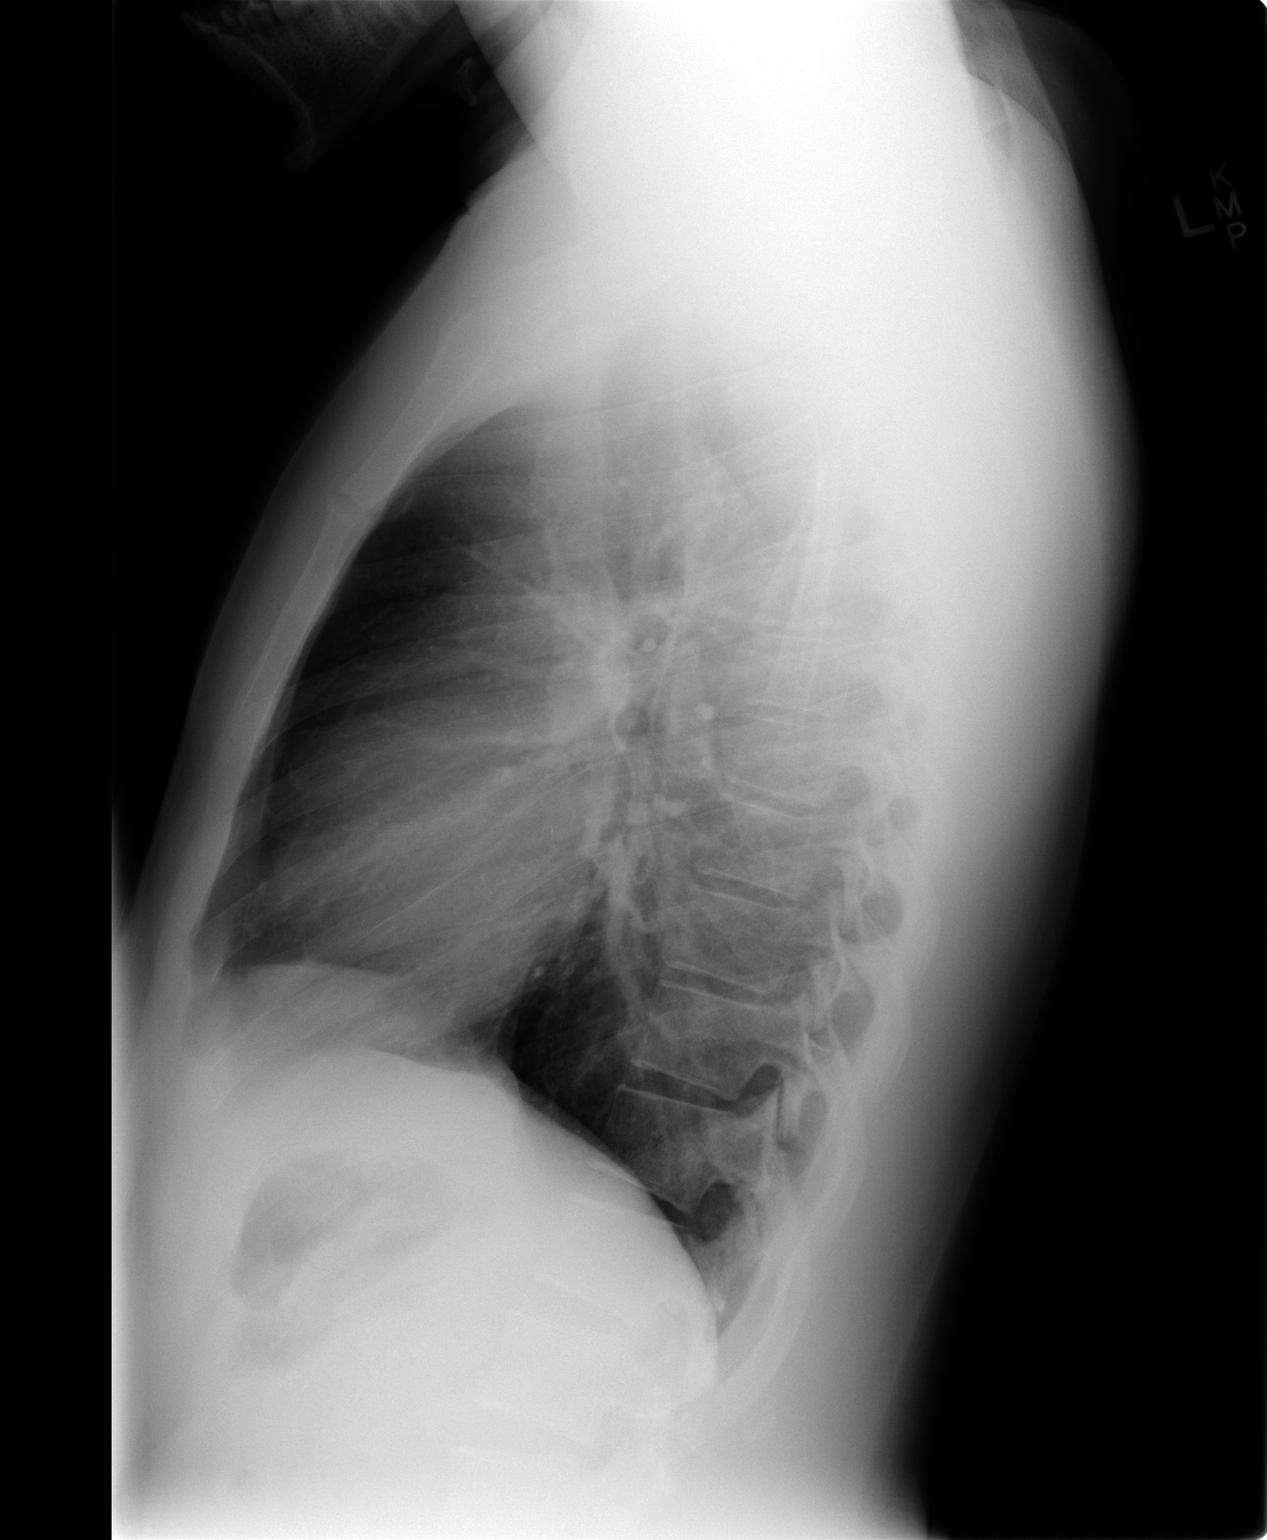

[2 of 2 positions shown; findings below may reference images not displayed]

FINDINGS: No active infiltrate or effusion is seen. Mediastinal and hilar
contours are unremarkable. The heart is within normal limits in
size. No bony abnormality is seen.
IMPRESSION: No active cardiopulmonary disease.

## 2016-02-13 ENCOUNTER — Other Ambulatory Visit: Payer: BLUE CROSS/BLUE SHIELD

## 2016-02-13 DIAGNOSIS — Z7251 High risk heterosexual behavior: Secondary | ICD-10-CM

## 2016-02-13 LAB — BASIC METABOLIC PANEL
BUN: 11 mg/dL (ref 7–25)
CHLORIDE: 102 mmol/L (ref 98–110)
CO2: 26 mmol/L (ref 20–31)
CREATININE: 1.02 mg/dL (ref 0.60–1.35)
Calcium: 9.3 mg/dL (ref 8.6–10.3)
Glucose, Bld: 124 mg/dL — ABNORMAL HIGH (ref 65–99)
Potassium: 4.5 mmol/L (ref 3.5–5.3)
Sodium: 138 mmol/L (ref 135–146)

## 2016-02-14 LAB — HIV ANTIBODY (ROUTINE TESTING W REFLEX): HIV 1&2 Ab, 4th Generation: NONREACTIVE

## 2016-02-17 ENCOUNTER — Ambulatory Visit: Payer: BLUE CROSS/BLUE SHIELD

## 2016-02-17 ENCOUNTER — Other Ambulatory Visit (HOSPITAL_COMMUNITY)
Admission: RE | Admit: 2016-02-17 | Discharge: 2016-02-17 | Disposition: A | Discharge status: Home / Self Care | Payer: Federal, State, Local not specified - PPO | Source: Ambulatory Visit | Attending: Infectious Disease | Admitting: Infectious Disease

## 2016-02-17 ENCOUNTER — Ambulatory Visit (INDEPENDENT_AMBULATORY_CARE_PROVIDER_SITE_OTHER): Payer: BLUE CROSS/BLUE SHIELD | Admitting: Pharmacist Clinician (PhC)/ Clinical Pharmacy Specialist

## 2016-02-17 DIAGNOSIS — Z7251 High risk heterosexual behavior: Secondary | ICD-10-CM | POA: Diagnosis not present

## 2016-02-17 DIAGNOSIS — Z113 Encounter for screening for infections with a predominantly sexual mode of transmission: Secondary | ICD-10-CM | POA: Diagnosis present

## 2016-02-17 MED ORDER — EMTRICITABINE-TENOFOVIR DF 200-300 MG PO TABS: 1.0000 | ORAL_TABLET | Freq: Every day | ORAL | 0 refills | Status: DC

## 2016-02-17 NOTE — Progress Notes (Signed)
Patient ID: Adam BanisterHarrison C Belkin, male   DOB: 06-Sep-1995, 20 y.o.   MRN: [id number] HPI: Adam Murphy is a 20 y.o. male who is here for his pharmacy PreP appt. He started on Truvada about a month ago.   Allergies: Allergies  Allergen Reactions  . Penicillins     hives  . Nickel Rash    Vitals:    Past Medical History: No past medical history on file.  Social History: Social History   Social History  . Marital status: Single    Spouse name: N/A  . Number of children: N/A  . Years of education: N/A   Social History Main Topics  . Smoking status: Never Smoker  . Smokeless tobacco: Not on file  . Alcohol use No  . Drug use: No  . Sexual activity: No   Other Topics Concern  . Not on file   Social History Narrative  . No narrative on file    Previous Regimen:   Current Regimen: Truvada  Labs: Hep B S Ab (no units)  Date Value  12/26/2015 NEG   Hepatitis B Surface Ag (no units)  Date Value  12/26/2015 NEGATIVE   HCV Ab (no units)  Date Value  12/26/2015 NEGATIVE    CrCl: CrCl cannot be calculated (Unknown ideal weight.).  Lipids: No results found for: CHOL, TRIG, HDL, CHOLHDL, VLDL, LDLCALC  Assessment: Romeo AppleHarrison has a new job now. I offered him to be in the study again but he said that the schedule is not stable yet so he will reconsider when it's more stable. Started on Truvada for PreP about a month ago. He is in a relationship with a HIV positive partner. They had about 30 sexual intercourse in the in the last month. He stated that the partner is suppressed on ART. He doesn't use condoms during these intercourses. However, he has also had 4 other intercourse outside of the relationship with different partners. He stated that he used condoms 100% of the time during these outside intercourses. He participate in both receptive and insertive. He has not missed doses in the last 30 days. I stressed the importance of being compliance in order for the drug to  work. His HIV test came back neg this past week. Scr wnl. Explained the risk of HIV again with the use of PreP. We are going to get rectal/oral/urinary GC and RPR today.   Recommendations:  Refill Truvada x 3 mo Come back in 3 mo for labs and STD testing  Clide CliffPham, Minh Quang, PharmD Clinical Infectious Disease Pharmacist [facility] 02/17/2016, 3:40 PM

## 2016-02-17 NOTE — Patient Instructions (Signed)
Cont Truvada daily Follow up for labs and pharmacy in 3 months

## 2016-02-18 MED FILL — TRUVADA 200-300 MG TABS: 200-300 | 30 days supply | Qty: 30 | Fill #0

## 2016-02-19 LAB — CYTOLOGY, (ORAL, ANAL, URETHRAL) ANCILLARY ONLY
CHLAMYDIA, DNA PROBE: NEGATIVE
Chlamydia: NEGATIVE
NEISSERIA GONORRHEA: NEGATIVE
Neisseria Gonorrhea: NEGATIVE

## 2016-02-19 LAB — URINE CYTOLOGY ANCILLARY ONLY
Chlamydia: NEGATIVE
Neisseria Gonorrhea: NEGATIVE

## 2016-02-24 ENCOUNTER — Telehealth: Payer: Self-pay

## 2016-02-24 NOTE — Telephone Encounter (Signed)
Patient called wanting the results of his GC done on 09/13. Told patient results were negative.Rejeana Brockandace Nalda Shackleford, LPN

## 2016-03-04 ENCOUNTER — Ambulatory Visit (HOSPITAL_COMMUNITY)
Admission: EM | Admit: 2016-03-04 | Discharge: 2016-03-04 | Disposition: A | Discharge status: Home / Self Care | Payer: Federal, State, Local not specified - PPO | Attending: Emergency Medicine | Admitting: Emergency Medicine

## 2016-03-04 ENCOUNTER — Encounter (HOSPITAL_COMMUNITY): Payer: Self-pay | Admitting: Emergency Medicine

## 2016-03-04 DIAGNOSIS — L0291 Cutaneous abscess, unspecified: Secondary | ICD-10-CM | POA: Diagnosis not present

## 2016-03-04 MED ORDER — SULFAMETHOXAZOLE-TRIMETHOPRIM 800-160 MG PO TABS: 1.0000 | ORAL_TABLET | Freq: Two times a day (BID) | ORAL | 0 refills | Status: AC

## 2016-03-04 NOTE — ED Triage Notes (Signed)
Patient reports a 2 day history of a knot in left groin that is worsening in pain and size.

## 2016-03-04 NOTE — ED Provider Notes (Signed)
CSN: [id number]     Arrival date & time 03/04/16  1901 History   First MD Initiated Contact with Patient 03/04/16 2028     Chief Complaint  Patient presents with  . Cyst   (Consider location/radiation/quality/duration/timing/severity/associated sxs/prior Treatment) Mr. Adam Murphy is a well-appearing 20 y.o male with no medical history, presents today for a red swollen lump to his left groin. This swollen lump have been present for 2 days. Patient reports that his job have had him moving around a lot and he believes the lump came on from  frictions between his upper thigh, causing the skin to break and got infected. He denies fever at home. He is afebrile in room. He endorses over the lump on palpation.       History reviewed. No pertinent past medical history. History reviewed. No pertinent surgical history. Family History  Problem Relation Age of Onset  . Diabetes Paternal Grandmother    Social History  Substance Use Topics  . Smoking status: Never Smoker  . Smokeless tobacco: Never Used  . Alcohol use No    Review of Systems  All other systems reviewed and are negative.   Allergies  Penicillins and Nickel  Home Medications   Prior to Admission medications   Medication Sig Start Date End Date Taking? Authorizing Provider  azithromycin (ZITHROMAX) 250 MG tablet Take 4 tablets (1,000 mg total) by mouth once. 1 Patient not taking: Reported on 03/04/2016 10/26/15   Rodolph BongEvan S Corey, MD  emtricitabine-tenofovir (TRUVADA) 200-300 MG tablet Take 1 tablet by mouth daily. 02/17/16   Randall Hissornelius N Van Dam, MD  sulfamethoxazole-trimethoprim (BACTRIM DS,SEPTRA DS) 800-160 MG tablet Take 1 tablet by mouth 2 (two) times daily. 03/04/16 03/11/16  Lucia EstelleFeng Dietrick Barris, NP   Meds Ordered and Administered this Visit  Medications - No data to display  BP 114/70 (BP Location: Left Arm)   Pulse 86   Temp 98.5 F (36.9 C) (Oral)   Resp 18   SpO2 99%  No data found.   Physical Exam  Constitutional: He is  oriented to person, place, and time. He appears well-developed and well-nourished.  Cardiovascular: Normal rate, regular rhythm and normal heart sounds.   Pulmonary/Chest: Effort normal and breath sounds normal. No respiratory distress.  Abdominal: Soft. Bowel sounds are normal. There is no tenderness.  Neurological: He is alert and oriented to person, place, and time.  Skin:  An area of red, painful and swollen lump noted at left groin that is indurated. Induration measures 5.5cm x 3cm.   Nursing note and vitals reviewed.   Urgent Care Course   Clinical Course    Procedures (including critical care time)  Labs Review Labs Reviewed - No data to display  Imaging Review No results found.    MDM   1. Abscess    Abscess is hard and indurated. Rx for bactrim given. Informed to take abx as prescribed. Informed to apply warm compress 4 times a day 15-20 min each time. Informed to return for f/u with PCP if the abscess comes to a head or if it does not resolve.      Lucia EstelleFeng Ersa Delaney, NP 03/04/16 2220

## 2016-03-04 NOTE — Discharge Instructions (Signed)
Please do warm compress 4 times a day, 15-20 min each time, and take the antibiotic as prescribed. If the abscess does not resolve or if it comes to a head, then you would need to see general surgery to get it lanced.

## 2016-03-19 MED FILL — TRUVADA 200-300 MG TABS: 200-300 | 30 days supply | Qty: 30 | Fill #1

## 2016-03-30 ENCOUNTER — Telehealth: Payer: Self-pay

## 2016-03-30 NOTE — Telephone Encounter (Signed)
Patient would like a copy of his immunization records, today if possible.  Please call when ready.  [phone]

## 2016-03-30 NOTE — Telephone Encounter (Signed)
Ready for pick up. Called patient and left voicemail.

## 2016-04-17 MED FILL — TRUVADA 200-300 MG TABS: 200-300 | 30 days supply | Qty: 30 | Fill #2

## 2016-05-11 ENCOUNTER — Other Ambulatory Visit: Payer: BLUE CROSS/BLUE SHIELD

## 2016-05-11 DIAGNOSIS — Z7251 High risk heterosexual behavior: Secondary | ICD-10-CM

## 2016-05-12 LAB — BASIC METABOLIC PANEL
BUN: 11 mg/dL (ref 7–25)
CALCIUM: 9.7 mg/dL (ref 8.6–10.3)
CO2: 27 mmol/L (ref 20–31)
Chloride: 103 mmol/L (ref 98–110)
Creat: 1.21 mg/dL (ref 0.60–1.35)
GLUCOSE: 81 mg/dL (ref 65–99)
POTASSIUM: 4.4 mmol/L (ref 3.5–5.3)
SODIUM: 140 mmol/L (ref 135–146)

## 2016-05-12 LAB — HIV ANTIBODY (ROUTINE TESTING W REFLEX): HIV 1&2 Ab, 4th Generation: NONREACTIVE

## 2016-05-18 ENCOUNTER — Ambulatory Visit: Payer: BLUE CROSS/BLUE SHIELD | Admitting: Pharmacist

## 2016-05-18 ENCOUNTER — Other Ambulatory Visit (HOSPITAL_COMMUNITY)
Admission: RE | Admit: 2016-05-18 | Discharge: 2016-05-18 | Disposition: A | Discharge status: Home / Self Care | Payer: BLUE CROSS/BLUE SHIELD | Source: Ambulatory Visit | Attending: Infectious Disease | Admitting: Infectious Disease

## 2016-05-18 DIAGNOSIS — Z7251 High risk heterosexual behavior: Secondary | ICD-10-CM

## 2016-05-18 DIAGNOSIS — Z113 Encounter for screening for infections with a predominantly sexual mode of transmission: Secondary | ICD-10-CM | POA: Insufficient documentation

## 2016-05-18 MED ORDER — EMTRICITABINE-TENOFOVIR DF 200-300 MG PO TABS: 1.0000 | ORAL_TABLET | Freq: Every day | ORAL | 0 refills | Status: DC

## 2016-05-18 MED FILL — TRUVADA 200-300 MG TABS: 200-300 | 30 days supply | Qty: 30 | Fill #0

## 2016-05-18 NOTE — Progress Notes (Signed)
HPI: Adam Murphy is a 20 y.o. male who presents to the [facility] pharmacy clinic today for HIV PrEP f/u.   Allergies: Allergies  Allergen Reactions  . Penicillins     hives  . Nickel Rash    Past Medical History: No past medical history on file.  Social History: Social History   Social History  . Marital status: Single    Spouse name: N/A  . Number of children: N/A  . Years of education: N/A   Social History Main Topics  . Smoking status: Never Smoker  . Smokeless tobacco: Never Used  . Alcohol use No  . Drug use: No  . Sexual activity: No   Other Topics Concern  . Not on file   Social History Narrative  . No narrative on file    Labs: Hep B S Ab (no units)  Date Value  12/26/2015 NEG   Hepatitis B Surface Ag (no units)  Date Value  12/26/2015 NEGATIVE   HCV Ab (no units)  Date Value  12/26/2015 NEGATIVE    CrCl: CrCl cannot be calculated (Unknown ideal weight.).  Lipids: No results found for: CHOL, TRIG, HDL, CHOLHDL, VLDL, LDLCALC  Assessment: Adam Murphy is here for PrEP follow up. He has been on Truvada for quite some time.  He says he has missed no doses in the last 3 months.  He does say he has some GI upset when taking it but it is nothing that prevents him from taking it.  He is in a relationship and his boyfriend is HIV positive.  He is on medication and undetectable.  They take their medications at the same time every night together.  He states that they have had some 3 way sexual encounters in the past 3 months - too many to count. He states that when he has sex with his boyfriend, he is mainly the insertive one and does not use condoms anymore (they used to).  They do use condoms when they have the 3 way encounters. I educated him about PrEP and condom use.  Also gave him some condoms.  He had HIV antibody and BMET drawn last week. HIV was negative and kidney function and everything looked good.  Will do G/C rectal, oral, urine, RPR today.  He  is interested in our PrEP study now that his work schedule is more stable and will allow him to come to visits. I will route this note to Selena BattenKim and Tania Adelisha to follow up with him after the beginning of the year. Since his HIV is negative, I will send refills in for his Truvada.   Plans: - Truvada once daily x 3 months - F/u with [facility] pharmacist 08/10/16 at 4pm - G/C oral, rectal, urine, RPR today - F/u after beginning of the year for PrEP study  Tamim Skog L. Paulino DoorKuppelweiser, BS, PharmD Infectious Diseases Clinical Pharmacist [facility] 05/18/2016, 4:42 PM

## 2016-05-19 LAB — RPR

## 2016-05-20 LAB — URINE CYTOLOGY ANCILLARY ONLY
Chlamydia: NEGATIVE
NEISSERIA GONORRHEA: NEGATIVE

## 2016-05-20 LAB — CYTOLOGY, (ORAL, ANAL, URETHRAL) ANCILLARY ONLY
CHLAMYDIA, DNA PROBE: POSITIVE — AB
Chlamydia: NEGATIVE
NEISSERIA GONORRHEA: NEGATIVE
Neisseria Gonorrhea: NEGATIVE

## 2016-05-21 ENCOUNTER — Encounter: Payer: Self-pay | Admitting: Pharmacist

## 2016-05-21 ENCOUNTER — Other Ambulatory Visit: Payer: Self-pay | Admitting: Pharmacist

## 2016-05-21 MED ORDER — AZITHROMYCIN 500 MG PO TABS: 1000.0000 mg | ORAL_TABLET | Freq: Once | ORAL | 0 refills | Status: DC

## 2016-05-21 MED ORDER — AZITHROMYCIN 500 MG PO TABS: 1000.0000 mg | ORAL_TABLET | Freq: Once | ORAL | 0 refills | Status: AC

## 2016-05-21 MED FILL — AZITHROMYCIN 500 MG TABLET: 500 | 1 days supply | Qty: 2 | Fill #0

## 2016-05-21 NOTE — Progress Notes (Signed)
Leoncio's rectal G/C came back positive for chlamydia. Called and told him.  [facility]  Also told him to get his boyfriend checked and anyone else he has been sexually active with lately.  Her verbalized understanding.

## 2016-06-17 MED FILL — TRUVADA 200-300 MG TABS: 200-300 | 30 days supply | Qty: 30 | Fill #1 | Status: TO

## 2016-07-15 MED FILL — TRUVADA 200-300 MG TABS: 200-300 | 30 days supply | Qty: 30 | Fill #0

## 2016-08-10 ENCOUNTER — Other Ambulatory Visit (HOSPITAL_COMMUNITY)
Admission: RE | Admit: 2016-08-10 | Discharge: 2016-08-10 | Disposition: A | Discharge status: Home / Self Care | Payer: BLUE CROSS/BLUE SHIELD | Source: Ambulatory Visit | Attending: Infectious Disease | Admitting: Infectious Disease

## 2016-08-10 ENCOUNTER — Ambulatory Visit (INDEPENDENT_AMBULATORY_CARE_PROVIDER_SITE_OTHER): Payer: BLUE CROSS/BLUE SHIELD | Admitting: Pharmacist

## 2016-08-10 DIAGNOSIS — Z7251 High risk heterosexual behavior: Secondary | ICD-10-CM

## 2016-08-10 DIAGNOSIS — Z113 Encounter for screening for infections with a predominantly sexual mode of transmission: Secondary | ICD-10-CM | POA: Insufficient documentation

## 2016-08-10 LAB — BASIC METABOLIC PANEL
BUN: 11 mg/dL (ref 7–25)
CHLORIDE: 100 mmol/L (ref 98–110)
CO2: 29 mmol/L (ref 20–31)
CREATININE: 1.13 mg/dL (ref 0.60–1.35)
Calcium: 9.6 mg/dL (ref 8.6–10.3)
GLUCOSE: 81 mg/dL (ref 65–99)
Potassium: 4.6 mmol/L (ref 3.5–5.3)
SODIUM: 137 mmol/L (ref 135–146)

## 2016-08-10 NOTE — Progress Notes (Signed)
HPI: Adam Murphy is a 21 y.o. male who presents to the [facility] pharmacy clinic today for his 3 month PrEP f/u.   Allergies: Allergies  Allergen Reactions  . Penicillins     hives  . Nickel Rash    Past Medical History: No past medical history on file.  Social History: Social History   Social History  . Marital status: Single    Spouse name: N/A  . Number of children: N/A  . Years of education: N/A   Social History Main Topics  . Smoking status: Never Smoker  . Smokeless tobacco: Never Used  . Alcohol use No  . Drug use: No  . Sexual activity: No   Other Topics Concern  . Not on file   Social History Narrative  . No narrative on file    Current Regimen: Truvada  Labs: Hep B S Ab (no units)  Date Value  12/26/2015 NEG   Hepatitis B Surface Ag (no units)  Date Value  12/26/2015 NEGATIVE   HCV Ab (no units)  Date Value  12/26/2015 NEGATIVE   Assessment: Adam AppleHarrison is here today for his 3 month PrEP follow up.  He has missed no doses of his Truvada since his last visit in December. He is tolerating it well with no side effects or issues. He is stressed today as he has been fighting with his parents about moving out of their house and getting a car.  He works at the surgical center.  He is still with his HIV + partner who is on medication. His partner takes his HIV medication and Adam AppleHarrison takes his Truvada every night around 10pm together.  He says he only has sexual intercourse with his HIV + partner, but they do have frequent 3 way encounters.  He states they have had around four 3 way sexual encounters since his last visit in December.  He was + for rectal chlamydia last time he was here and finished treatment for that.  His partner and recent sexual encounters were all negative.  He think he got it from another 3 way he had where he was the receptive partner.  He is usually the insertive most of the time.  He and his partner do not use condoms, but they do when  they have the 3 way sexual encounters.  He did admit that his partner has had a few partners besides him. He has no s/sx of HIV or STDs at this time. Will get G/C rectal, oral, urine, and RPR today, as well as an HIV antibody. Again counseled on the importance of condom use to eliminate STD transmission.  Of note, his SCr was a tad elevated last visit at 1.21.  Will recheck BMET today.  He did meet with Tania AdeElisha today about our HPTN study. He was super interested back in December but now says since he does not know where he will live if he moves out of his parents house that he would like to wait. He will f/u with Tania AdeElisha in May.   Plans: - G/C oral, rectal, urine, BMET, RPR today - HIV antibody today and will refill Truvada if negative - F/u with [facility] pharmacist 5/31 at 4pm - F/u with Tania AdeElisha for HPTN study 5/21 at 3pm  Cassie L. Kuppelweiser, PharmD, CPP Infectious Diseases Clinical Pharmacist [facility] 08/10/2016, 4:14 PM

## 2016-08-11 ENCOUNTER — Other Ambulatory Visit: Payer: Self-pay | Admitting: Pharmacist

## 2016-08-11 DIAGNOSIS — Z7251 High risk heterosexual behavior: Secondary | ICD-10-CM

## 2016-08-11 LAB — HIV ANTIBODY (ROUTINE TESTING W REFLEX): HIV: NONREACTIVE

## 2016-08-11 LAB — CYTOLOGY, (ORAL, ANAL, URETHRAL) ANCILLARY ONLY
CHLAMYDIA, DNA PROBE: NEGATIVE
Chlamydia: NEGATIVE
NEISSERIA GONORRHEA: NEGATIVE
Neisseria Gonorrhea: NEGATIVE

## 2016-08-11 LAB — RPR

## 2016-08-11 MED ORDER — EMTRICITABINE-TENOFOVIR DF 200-300 MG PO TABS: 1.0000 | ORAL_TABLET | Freq: Every day | ORAL | 0 refills | Status: DC

## 2016-08-11 MED FILL — TRUVADA 200-300 MG TABS: 200-300 | 90 days supply | Qty: 90 | Fill #0

## 2016-08-12 LAB — URINE CYTOLOGY ANCILLARY ONLY
Chlamydia: NEGATIVE
Neisseria Gonorrhea: NEGATIVE

## 2016-08-14 ENCOUNTER — Telehealth: Payer: Self-pay | Admitting: Pharmacist

## 2016-08-14 NOTE — Telephone Encounter (Signed)
Sloan's HIV antibody was negative when tested at last PrEP visit. Refilled his Truvada x  3 months. No STDs.

## 2016-10-26 ENCOUNTER — Encounter (INDEPENDENT_AMBULATORY_CARE_PROVIDER_SITE_OTHER): Payer: BLUE CROSS/BLUE SHIELD | Admitting: *Deleted

## 2016-10-26 VITALS — BP 115/75 | HR 86 | Temp 98.4°F | Ht 65.0 in | Wt 159.5 lb

## 2016-10-26 DIAGNOSIS — Z006 Encounter for examination for normal comparison and control in clinical research program: Secondary | ICD-10-CM

## 2016-10-26 LAB — CBC WITH DIFFERENTIAL/PLATELET
BASOS ABS: 43 {cells}/uL (ref 0–200)
Basophils Relative: 1 %
EOS ABS: 172 {cells}/uL (ref 15–500)
Eosinophils Relative: 4 %
HCT: 39.6 % (ref 38.5–50.0)
Hemoglobin: 13.9 g/dL (ref 13.2–17.1)
LYMPHS PCT: 53 %
Lymphs Abs: 2279 cells/uL (ref 850–3900)
MCH: 30.7 pg (ref 27.0–33.0)
MCHC: 35.1 g/dL (ref 32.0–36.0)
MCV: 87.4 fL (ref 80.0–100.0)
MONOS PCT: 11 %
MPV: 10 fL (ref 7.5–12.5)
Monocytes Absolute: 473 cells/uL (ref 200–950)
NEUTROS PCT: 31 %
Neutro Abs: 1333 cells/uL — ABNORMAL LOW (ref 1500–7800)
PLATELETS: 259 10*3/uL (ref 140–400)
RBC: 4.53 MIL/uL (ref 4.20–5.80)
RDW: 13.8 % (ref 11.0–15.0)
WBC: 4.3 10*3/uL (ref 3.8–10.8)

## 2016-10-26 NOTE — Progress Notes (Signed)
Adam Murphy is here to screen for Study: A Phase 2b/3 Double Blind Safety and Efficacy Study of Injectable Cabotegravir compared to Daily Oral Tenofovir Disoproxil Fumarate/Emtricitabine (TDF/FTC), For Pre-Exposure Prophylaxis in HIV-Uninfected Cisgender Men and Transgender Women who have sex with Men.  Medication: Investigational Injectable Cabotegravir/placebo compared to Truvada/placebo. Duration: Around 4 years.  We reviewed the consent together and informed consent was obtained via SOP guidelines. He has been on PREP since August and has a good understanding of the risk of HIV. His partner is HIV positive but they both have encounters outside of that relationship. His sexpro score was 1. He denies any history of seizures, bleeding disorders or tattoos. He denies any current problems or significant health history. He has had chlamydia and gonnorrhea before, chlamydia within the last 6 months. We have planned to have him return next week and we will get his EKG, viral  Load and have Dr. Luciana Axeomer see him for a PE.

## 2016-10-27 LAB — HIV ANTIBODY (ROUTINE TESTING W REFLEX): HIV 1&2 Ab, 4th Generation: NONREACTIVE

## 2016-10-27 LAB — COMPREHENSIVE METABOLIC PANEL
ALT: 34 U/L (ref 9–46)
AST: 22 U/L (ref 10–40)
Albumin: 4.4 g/dL (ref 3.6–5.1)
Alkaline Phosphatase: 63 U/L (ref 40–115)
BILIRUBIN TOTAL: 0.5 mg/dL (ref 0.2–1.2)
BUN: 9 mg/dL (ref 7–25)
CO2: 26 mmol/L (ref 20–31)
CREATININE: 1.16 mg/dL (ref 0.60–1.35)
Calcium: 9.4 mg/dL (ref 8.6–10.3)
Chloride: 105 mmol/L (ref 98–110)
GLUCOSE: 66 mg/dL (ref 65–99)
Potassium: 4.5 mmol/L (ref 3.5–5.3)
SODIUM: 142 mmol/L (ref 135–146)
Total Protein: 7.1 g/dL (ref 6.1–8.1)

## 2016-10-27 LAB — HEPATITIS B SURFACE ANTIGEN: Hepatitis B Surface Ag: NEGATIVE

## 2016-10-27 LAB — HEPATITIS C ANTIBODY: HCV Ab: NEGATIVE

## 2016-11-03 ENCOUNTER — Encounter (INDEPENDENT_AMBULATORY_CARE_PROVIDER_SITE_OTHER): Payer: BLUE CROSS/BLUE SHIELD | Admitting: Internal Medicine

## 2016-11-03 ENCOUNTER — Ambulatory Visit: Payer: BLUE CROSS/BLUE SHIELD | Admitting: Internal Medicine

## 2016-11-03 VITALS — BP 105/74 | HR 76 | Temp 98.1°F | Wt 157.5 lb

## 2016-11-03 DIAGNOSIS — Z006 Encounter for examination for normal comparison and control in clinical research program: Secondary | ICD-10-CM

## 2016-11-03 NOTE — Progress Notes (Signed)
   Subjective:    Patient ID: Adam Murphy, male    DOB: [birth year], 21 y.o.   MRN: [id number]  HPI Here for HPTN entry study exam.    Review of Systems     Objective:   Physical Exam  Constitutional: He appears well-developed and well-nourished. No distress.  HENT:  Right Ear: External ear normal.  Left Ear: External ear normal.  Mouth/Throat: Oropharynx is clear and moist. No oropharyngeal exudate.  Eyes: No scleral icterus.  Cardiovascular: Normal rate, regular rhythm and normal heart sounds.   No murmur heard. Pulmonary/Chest: Effort normal and breath sounds normal. No respiratory distress.  Abdominal: Soft. Bowel sounds are normal. He exhibits no distension. There is no tenderness. There is no rebound.  Musculoskeletal: He exhibits no edema.  Lymphadenopathy:    He has no cervical adenopathy.  Skin: No rash noted.  Psychiatric: He has a normal mood and affect.          Assessment & Plan:

## 2016-11-04 NOTE — Progress Notes (Signed)
Adam Murphy came in to complete the screening visit [facility]. PE was done by Dr. Luciana Axeomer, and EKG was obtained which showed NSR and a viral load was drawn to probe eligibility. He is tentatively scheduled for next Tuesday at 3pm. He denies any new problems or concerns and will be starting a new job at [employer] June 11th.

## 2016-11-05 ENCOUNTER — Ambulatory Visit: Payer: BLUE CROSS/BLUE SHIELD

## 2016-11-05 LAB — HIV-1 RNA QUANT-NO REFLEX-BLD
HIV 1 RNA QUANT: NOT DETECTED {copies}/mL
HIV-1 RNA QUANT, LOG: NOT DETECTED {Log_copies}/mL

## 2016-11-10 ENCOUNTER — Encounter (INDEPENDENT_AMBULATORY_CARE_PROVIDER_SITE_OTHER): Payer: BLUE CROSS/BLUE SHIELD | Admitting: *Deleted

## 2016-11-10 VITALS — BP 106/72 | HR 81 | Temp 98.3°F | Wt 158.2 lb

## 2016-11-10 DIAGNOSIS — Z006 Encounter for examination for normal comparison and control in clinical research program: Secondary | ICD-10-CM

## 2016-11-10 LAB — CBC WITH DIFFERENTIAL/PLATELET
Basophils Absolute: 0 cells/uL (ref 0–200)
Basophils Relative: 0 %
EOS PCT: 3 %
Eosinophils Absolute: 144 cells/uL (ref 15–500)
HEMATOCRIT: 40.1 % (ref 38.5–50.0)
Hemoglobin: 13.9 g/dL (ref 13.2–17.1)
LYMPHS PCT: 49 %
Lymphs Abs: 2352 cells/uL (ref 850–3900)
MCH: 30.3 pg (ref 27.0–33.0)
MCHC: 34.7 g/dL (ref 32.0–36.0)
MCV: 87.4 fL (ref 80.0–100.0)
MPV: 10 fL (ref 7.5–12.5)
Monocytes Absolute: 288 cells/uL (ref 200–950)
Monocytes Relative: 6 %
NEUTROS PCT: 42 %
Neutro Abs: 2016 cells/uL (ref 1500–7800)
Platelets: 269 10*3/uL (ref 140–400)
RBC: 4.59 MIL/uL (ref 4.20–5.80)
RDW: 13.7 % (ref 11.0–15.0)
WBC: 4.8 10*3/uL (ref 3.8–10.8)

## 2016-11-10 NOTE — Progress Notes (Signed)
Met w/ppt in [place]s office to confirm mailing address for [place] mail. Ppt gave verbal consent to receive mail from research team. Gave ppt information about mychart and sent the signup request to ppt's email as well as cell phone via Epic.  Ppt would like appt reminders via text from research team.  Ppt's new job has hours from 8-5.
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
## 2016-11-11 ENCOUNTER — Telehealth: Payer: Self-pay | Admitting: *Deleted

## 2016-11-11 DIAGNOSIS — Z113 Encounter for screening for infections with a predominantly sexual mode of transmission: Secondary | ICD-10-CM

## 2016-11-11 LAB — COMPREHENSIVE METABOLIC PANEL
ALT: 28 U/L (ref 9–46)
AST: 23 U/L (ref 10–40)
Albumin: 4.3 g/dL (ref 3.6–5.1)
Alkaline Phosphatase: 53 U/L (ref 40–115)
BUN: 10 mg/dL (ref 7–25)
CO2: 26 mmol/L (ref 20–31)
CREATININE: 1.09 mg/dL (ref 0.60–1.35)
Calcium: 9.4 mg/dL (ref 8.6–10.3)
Chloride: 102 mmol/L (ref 98–110)
GLUCOSE: 80 mg/dL (ref 65–99)
Potassium: 4.5 mmol/L (ref 3.5–5.3)
SODIUM: 140 mmol/L (ref 135–146)
Total Bilirubin: 0.7 mg/dL (ref 0.2–1.2)
Total Protein: 7.2 g/dL (ref 6.1–8.1)

## 2016-11-11 LAB — RPR

## 2016-11-11 LAB — LIPID PANEL
CHOL/HDL RATIO: 2.6 ratio (ref <5.0)
Cholesterol: 119 mg/dL (ref <200)
HDL: 46 mg/dL (ref >40)
LDL Cholesterol: 64 mg/dL (ref <100)
Triglycerides: 47 mg/dL (ref <150)
VLDL: 9 mg/dL (ref <30)

## 2016-11-11 LAB — HEPATITIS B CORE ANTIBODY, TOTAL: Hep B Core Total Ab: NONREACTIVE

## 2016-11-11 LAB — HIV ANTIBODY (ROUTINE TESTING W REFLEX): HIV: NONREACTIVE

## 2016-11-11 LAB — LIPASE: LIPASE: 8 U/L (ref 7–60)

## 2016-11-11 LAB — PHOSPHORUS: Phosphorus: 3.6 mg/dL (ref 2.5–4.5)

## 2016-11-11 LAB — HEPATITIS B SURFACE ANTIBODY,QUALITATIVE: Hep B S Ab: NEGATIVE

## 2016-11-11 LAB — CK: Total CK: 379 U/L — ABNORMAL HIGH (ref 44–196)

## 2016-11-11 LAB — AMYLASE: Amylase: 37 U/L (ref 21–101)

## 2016-11-11 LAB — GC/CHLAMYDIA PROBE AMP
CT Probe RNA: DETECTED — AB
GC PROBE AMP APTIMA: NOT DETECTED

## 2016-11-11 MED ORDER — AZITHROMYCIN 500 MG PO TABS: 1000.0000 mg | ORAL_TABLET | Freq: Once | ORAL | 0 refills | Status: AC

## 2016-11-11 NOTE — Telephone Encounter (Signed)
Adam Murphy's urine chlamydia test from yesterday is positive. Dr. Daiva EvesVan Dam said to give him azithromycin 1000mg  po x 1 dose. I called Adam Murphy and let him know about and told him his partner also needs to get tested. [facility] [address]

## 2016-11-12 LAB — CT/NG RNA, TMA RECTAL
Chlamydia Trachomatis RNA: NOT DETECTED
Neisseria Gonorrhoeae RNA: NOT DETECTED

## 2016-11-20 ENCOUNTER — Encounter (INDEPENDENT_AMBULATORY_CARE_PROVIDER_SITE_OTHER): Payer: BLUE CROSS/BLUE SHIELD | Admitting: *Deleted

## 2016-11-20 VITALS — BP 109/74 | HR 88 | Temp 98.4°F | Wt 160.2 lb

## 2016-11-20 DIAGNOSIS — Z202 Contact with and (suspected) exposure to infections with a predominantly sexual mode of transmission: Secondary | ICD-10-CM

## 2016-11-20 DIAGNOSIS — Z006 Encounter for examination for normal comparison and control in clinical research program: Secondary | ICD-10-CM

## 2016-11-20 LAB — CBC WITH DIFFERENTIAL/PLATELET
BASOS PCT: 0 %
Basophils Absolute: 0 cells/uL (ref 0–200)
Eosinophils Absolute: 100 cells/uL (ref 15–500)
Eosinophils Relative: 2 %
HCT: 44 % (ref 38.5–50.0)
Hemoglobin: 14.6 g/dL (ref 13.2–17.1)
LYMPHS PCT: 48 %
Lymphs Abs: 2400 cells/uL (ref 850–3900)
MCH: 29.6 pg (ref 27.0–33.0)
MCHC: 33.2 g/dL (ref 32.0–36.0)
MCV: 89.2 fL (ref 80.0–100.0)
MONOS PCT: 7 %
MPV: 10 fL (ref 7.5–12.5)
Monocytes Absolute: 350 cells/uL (ref 200–950)
NEUTROS ABS: 2150 {cells}/uL (ref 1500–7800)
Neutrophils Relative %: 43 %
Platelets: 266 10*3/uL (ref 140–400)
RBC: 4.93 MIL/uL (ref 4.20–5.80)
RDW: 13.7 % (ref 11.0–15.0)
WBC: 5 10*3/uL (ref 3.8–10.8)

## 2016-11-20 LAB — HIV ANTIBODY (ROUTINE TESTING W REFLEX): HIV: NONREACTIVE

## 2016-11-20 LAB — COMPREHENSIVE METABOLIC PANEL
ALK PHOS: 58 U/L (ref 40–115)
ALT: 30 U/L (ref 9–46)
AST: 21 U/L (ref 10–40)
Albumin: 4.3 g/dL (ref 3.6–5.1)
BUN: 12 mg/dL (ref 7–25)
CHLORIDE: 102 mmol/L (ref 98–110)
CO2: 28 mmol/L (ref 20–31)
CREATININE: 1.23 mg/dL (ref 0.60–1.35)
Calcium: 9.4 mg/dL (ref 8.6–10.3)
GLUCOSE: 124 mg/dL — AB (ref 65–99)
POTASSIUM: 4.4 mmol/L (ref 3.5–5.3)
Sodium: 140 mmol/L (ref 135–146)
TOTAL PROTEIN: 7.1 g/dL (ref 6.1–8.1)
Total Bilirubin: 0.8 mg/dL (ref 0.2–1.2)

## 2016-11-20 LAB — AMYLASE: Amylase: 42 U/L (ref 21–101)

## 2016-11-20 LAB — CK: CK TOTAL: 341 U/L — AB (ref 44–196)

## 2016-11-20 LAB — PHOSPHORUS: PHOSPHORUS: 3.7 mg/dL (ref 2.5–4.5)

## 2016-11-20 LAB — LIPASE: Lipase: 10 U/L (ref 7–60)

## 2016-11-20 MED ORDER — METRONIDAZOLE 500 MG PO TABS: ORAL_TABLET | ORAL | 0 refills | Status: AC

## 2016-11-20 NOTE — Addendum Note (Signed)
Addended by: Phill MyronEPPERSON, Darrall Strey C on: 11/20/2016 12:14 PM   Modules accepted: Orders

## 2016-11-20 NOTE — Progress Notes (Addendum)
Adam Murphy is here for his 2 week visit for the HPTN 083 study. He says he noticed an increase in SOB when working out right after he started taking the study meds, but it has gotten better now. He has been 100% adherent with the pills. He is unable to give us a urine sample to make up for the one not done at entry. He said his partner got tested for STDs and they told him he had trichomonas and not chlamydia. Adam Murphy is asking for treatment for that. And did take treatment for chlamydia. He did say that he has noticed a clear slight discharge from his penis recently. I recommended he go to the [facility] for treatment, but his work schedule does not accommodate their schedule. I will check with Dr. Daiva EvesVan Dam about treatment for him for that. He was also Hep B surface antibody negative though he has been vaccinated for it. I did instruct him to get a repeat vaccination. He will return in 2 weeks for the next visit.  I checked with Dr. Daiva EvesVan Dam and he said to give [facility] metronidazole 2 Gm x 1. I will let the patient know.

## 2016-11-23 ENCOUNTER — Encounter: Payer: BLUE CROSS/BLUE SHIELD | Admitting: *Deleted

## 2016-12-08 ENCOUNTER — Encounter: Payer: Self-pay | Admitting: *Deleted

## 2016-12-08 ENCOUNTER — Encounter (INDEPENDENT_AMBULATORY_CARE_PROVIDER_SITE_OTHER): Payer: BLUE CROSS/BLUE SHIELD | Admitting: *Deleted

## 2016-12-08 VITALS — BP 108/73 | HR 89 | Temp 98.6°F | Wt 164.8 lb

## 2016-12-08 DIAGNOSIS — Z006 Encounter for examination for normal comparison and control in clinical research program: Secondary | ICD-10-CM

## 2016-12-08 LAB — PHOSPHORUS: Phosphorus: 4.6 mg/dL — ABNORMAL HIGH (ref 2.5–4.5)

## 2016-12-08 LAB — COMPREHENSIVE METABOLIC PANEL
ALT: 23 U/L (ref 9–46)
AST: 17 U/L (ref 10–40)
Albumin: 4.1 g/dL (ref 3.6–5.1)
Alkaline Phosphatase: 58 U/L (ref 40–115)
BUN: 13 mg/dL (ref 7–25)
CALCIUM: 9 mg/dL (ref 8.6–10.3)
CHLORIDE: 103 mmol/L (ref 98–110)
CO2: 24 mmol/L (ref 20–31)
Creat: 1.19 mg/dL (ref 0.60–1.35)
GLUCOSE: 87 mg/dL (ref 65–99)
POTASSIUM: 4.2 mmol/L (ref 3.5–5.3)
Sodium: 137 mmol/L (ref 135–146)
Total Bilirubin: 0.5 mg/dL (ref 0.2–1.2)
Total Protein: 6.8 g/dL (ref 6.1–8.1)

## 2016-12-08 LAB — POCT URINALYSIS DIPSTICK
BILIRUBIN UA: NEGATIVE
Glucose, UA: NEGATIVE
Ketones, UA: NEGATIVE
Leukocytes, UA: NEGATIVE
NITRITE UA: NEGATIVE
Protein, UA: NEGATIVE
RBC UA: NEGATIVE
Spec Grav, UA: 1.015 (ref 1.010–1.025)
UROBILINOGEN UA: 0.2 U/dL
pH, UA: 6.5 (ref 5.0–8.0)

## 2016-12-08 LAB — CBC WITH DIFFERENTIAL/PLATELET
Basophils Absolute: 0 cells/uL (ref 0–200)
Basophils Relative: 0 %
EOS ABS: 122 {cells}/uL (ref 15–500)
Eosinophils Relative: 2 %
HEMATOCRIT: 41.5 % (ref 38.5–50.0)
Hemoglobin: 13.9 g/dL (ref 13.2–17.1)
LYMPHS PCT: 43 %
Lymphs Abs: 2623 cells/uL (ref 850–3900)
MCH: 29.9 pg (ref 27.0–33.0)
MCHC: 33.5 g/dL (ref 32.0–36.0)
MCV: 89.2 fL (ref 80.0–100.0)
MONO ABS: 549 {cells}/uL (ref 200–950)
MPV: 9.4 fL (ref 7.5–12.5)
Monocytes Relative: 9 %
NEUTROS PCT: 46 %
Neutro Abs: 2806 cells/uL (ref 1500–7800)
Platelets: 265 10*3/uL (ref 140–400)
RBC: 4.65 MIL/uL (ref 4.20–5.80)
RDW: 13.5 % (ref 11.0–15.0)
WBC: 6.1 10*3/uL (ref 3.8–10.8)

## 2016-12-08 NOTE — Progress Notes (Signed)
Adam Murphy is here for his week 4 visit [facility]. He denies any new problems or concerns. He thinks he is on the cabotegravir arm of the study, because he notices that he gets drunk much easier than when he took truvada prior to the study. His adherence is 100 % and he is ready to move in to the step 2 of the study. He will return next Wednesday for week 5.

## 2016-12-09 LAB — LIPASE: LIPASE: 14 U/L (ref 7–60)

## 2016-12-09 LAB — HIV ANTIBODY (ROUTINE TESTING W REFLEX): HIV 1&2 Ab, 4th Generation: NONREACTIVE

## 2016-12-09 LAB — AMYLASE: Amylase: 43 U/L (ref 21–101)

## 2016-12-09 LAB — CK: CK TOTAL: 409 U/L — AB (ref 44–196)

## 2016-12-16 ENCOUNTER — Encounter (INDEPENDENT_AMBULATORY_CARE_PROVIDER_SITE_OTHER): Payer: BLUE CROSS/BLUE SHIELD | Admitting: *Deleted

## 2016-12-16 VITALS — BP 105/74 | HR 90 | Temp 98.4°F | Wt 164.0 lb

## 2016-12-16 DIAGNOSIS — Z006 Encounter for examination for normal comparison and control in clinical research program: Secondary | ICD-10-CM

## 2016-12-17 LAB — HIV ANTIBODY (ROUTINE TESTING W REFLEX): HIV: NONREACTIVE

## 2016-12-17 NOTE — Progress Notes (Signed)
Study: A Phase 2b/3 Double Blind Safety and Efficacy Study of Injectable Cabotegravir compared to Daily Oral Tenofovir Disoproxil Fumarate/Emtricitabine (TDF/FTC), For Pre-Exposure Prophylaxis in HIV-Uninfected Cisgender Men and Transgender Women who have sex with Men.  Medication: Investigational Injectable Cabotegravir/placebo compared to Truvada/placebo. Duration: Around 4 years.  Adam Murphy is here for week 5. HIV confirmed non-reactive. Questionnaires completed. He returned 24 pills of both oral study products. Assessment unchanged since last study visit. Injection given in right gluteal muscle with no problem. He received $50 giftcard for visit. Will see him Tuesday for follow up visit.

## 2016-12-22 ENCOUNTER — Encounter (INDEPENDENT_AMBULATORY_CARE_PROVIDER_SITE_OTHER): Payer: BLUE CROSS/BLUE SHIELD | Admitting: *Deleted

## 2016-12-22 VITALS — BP 112/75 | HR 78 | Temp 98.0°F | Wt 167.5 lb

## 2016-12-22 DIAGNOSIS — Z006 Encounter for examination for normal comparison and control in clinical research program: Secondary | ICD-10-CM

## 2016-12-22 LAB — CBC WITH DIFFERENTIAL/PLATELET
BASOS PCT: 0 %
Basophils Absolute: 0 cells/uL (ref 0–200)
EOS PCT: 2 %
Eosinophils Absolute: 118 cells/uL (ref 15–500)
HEMATOCRIT: 41.6 % (ref 38.5–50.0)
HEMOGLOBIN: 13.8 g/dL (ref 13.2–17.1)
LYMPHS ABS: 2242 {cells}/uL (ref 850–3900)
Lymphocytes Relative: 38 %
MCH: 29.9 pg (ref 27.0–33.0)
MCHC: 33.2 g/dL (ref 32.0–36.0)
MCV: 90 fL (ref 80.0–100.0)
MONO ABS: 531 {cells}/uL (ref 200–950)
MPV: 9.7 fL (ref 7.5–12.5)
Monocytes Relative: 9 %
NEUTROS ABS: 3009 {cells}/uL (ref 1500–7800)
Neutrophils Relative %: 51 %
Platelets: 271 10*3/uL (ref 140–400)
RBC: 4.62 MIL/uL (ref 4.20–5.80)
RDW: 12.8 % (ref 11.0–15.0)
WBC: 5.9 10*3/uL (ref 3.8–10.8)

## 2016-12-22 LAB — COMPREHENSIVE METABOLIC PANEL
ALBUMIN: 4.2 g/dL (ref 3.6–5.1)
ALK PHOS: 58 U/L (ref 40–115)
ALT: 39 U/L (ref 9–46)
AST: 27 U/L (ref 10–40)
BILIRUBIN TOTAL: 0.5 mg/dL (ref 0.2–1.2)
BUN: 10 mg/dL (ref 7–25)
CALCIUM: 9.1 mg/dL (ref 8.6–10.3)
CO2: 27 mmol/L (ref 20–31)
Chloride: 101 mmol/L (ref 98–110)
Creat: 1.11 mg/dL (ref 0.60–1.35)
Glucose, Bld: 86 mg/dL (ref 65–99)
Potassium: 4.6 mmol/L (ref 3.5–5.3)
Sodium: 137 mmol/L (ref 135–146)
TOTAL PROTEIN: 6.9 g/dL (ref 6.1–8.1)

## 2016-12-22 LAB — PHOSPHORUS: PHOSPHORUS: 3.9 mg/dL (ref 2.5–4.5)

## 2016-12-22 LAB — HIV ANTIBODY (ROUTINE TESTING W REFLEX): HIV: NONREACTIVE

## 2016-12-22 NOTE — Progress Notes (Signed)
Adam Murphy is here for his week 6 study visit. He says he did have a lot of injection site pain the day after the injection and that it still continued to be sore. He also has a small knot at the injection site on the rt buttock approx. 1/2 inch in diameter. He didn't notice any redness or warmth with the site earlier.Marland Kitchen. He also continues to c/o clear discharge from his penis occasionally , but says it is just a small amount and usually is with an erection. He says that he and his partner were treated for chlamydia after his partner tested positive and he hasn't had sex with anyone else. I reassured him that it was probably not anything to worry about but if it caused burning or increased in amount to get it checked at the health department. He will return in 3 weeks for the next injection.

## 2016-12-23 LAB — AMYLASE: AMYLASE: 38 U/L (ref 21–101)

## 2016-12-23 LAB — LIPASE: LIPASE: 9 U/L (ref 7–60)

## 2016-12-23 LAB — CK: Total CK: 440 U/L — ABNORMAL HIGH (ref 44–196)

## 2016-12-24 ENCOUNTER — Other Ambulatory Visit: Payer: Self-pay | Admitting: *Deleted

## 2016-12-24 DIAGNOSIS — Z006 Encounter for examination for normal comparison and control in clinical research program: Secondary | ICD-10-CM

## 2016-12-24 MED ORDER — EMTRICITABINE-TENOFOVIR DF 200-300 MG PO TABS: ORAL_TABLET | ORAL | 11 refills | Status: DC

## 2016-12-24 MED ORDER — STUDY - INVESTIGATIONAL MEDICATION: 99 refills | Status: DC

## 2017-01-12 ENCOUNTER — Encounter (INDEPENDENT_AMBULATORY_CARE_PROVIDER_SITE_OTHER): Payer: Self-pay | Admitting: *Deleted

## 2017-01-12 VITALS — BP 105/71 | HR 84 | Temp 98.6°F | Wt 174.8 lb

## 2017-01-12 DIAGNOSIS — Z006 Encounter for examination for normal comparison and control in clinical research program: Secondary | ICD-10-CM

## 2017-01-12 LAB — CBC WITH DIFFERENTIAL/PLATELET
BASOS PCT: 0 %
Basophils Absolute: 0 cells/uL (ref 0–200)
EOS PCT: 2 %
Eosinophils Absolute: 118 cells/uL (ref 15–500)
HCT: 43.1 % (ref 38.5–50.0)
HEMOGLOBIN: 14.4 g/dL (ref 13.2–17.1)
LYMPHS ABS: 2596 {cells}/uL (ref 850–3900)
Lymphocytes Relative: 44 %
MCH: 30.1 pg (ref 27.0–33.0)
MCHC: 33.4 g/dL (ref 32.0–36.0)
MCV: 90.2 fL (ref 80.0–100.0)
MONOS PCT: 7 %
MPV: 9.8 fL (ref 7.5–12.5)
Monocytes Absolute: 413 cells/uL (ref 200–950)
NEUTROS ABS: 2773 {cells}/uL (ref 1500–7800)
Neutrophils Relative %: 47 %
PLATELETS: 299 10*3/uL (ref 140–400)
RBC: 4.78 MIL/uL (ref 4.20–5.80)
RDW: 12.6 % (ref 11.0–15.0)
WBC: 5.9 10*3/uL (ref 3.8–10.8)

## 2017-01-12 NOTE — Progress Notes (Signed)
Adam Murphy is here for his week 9 injection visit [facility]. He denies any new complaints but does say he started taking ceterizine for allergies ( nasal drainage). He denies any unprotected sex recently. An injection of the study product was administered in the left buttock without problem. He was told to use ice and tylenol/advil for any discomfort. He had developed a knot from the last injection so is concerned about having a reaction this time. He will return next week for followup.

## 2017-01-13 LAB — COMPREHENSIVE METABOLIC PANEL
ALK PHOS: 69 U/L (ref 40–115)
ALT: 18 U/L (ref 9–46)
AST: 16 U/L (ref 10–40)
Albumin: 4.4 g/dL (ref 3.6–5.1)
BILIRUBIN TOTAL: 0.5 mg/dL (ref 0.2–1.2)
BUN: 7 mg/dL (ref 7–25)
CO2: 28 mmol/L (ref 20–32)
CREATININE: 1.01 mg/dL (ref 0.60–1.35)
Calcium: 9.7 mg/dL (ref 8.6–10.3)
Chloride: 103 mmol/L (ref 98–110)
Glucose, Bld: 78 mg/dL (ref 65–99)
Potassium: 4.6 mmol/L (ref 3.5–5.3)
SODIUM: 140 mmol/L (ref 135–146)
TOTAL PROTEIN: 7.3 g/dL (ref 6.1–8.1)

## 2017-01-13 LAB — PHOSPHORUS: PHOSPHORUS: 3.1 mg/dL (ref 2.5–4.5)

## 2017-01-13 LAB — HIV ANTIBODY (ROUTINE TESTING W REFLEX): HIV: NONREACTIVE

## 2017-01-13 LAB — LIPASE: LIPASE: 13 U/L (ref 7–60)

## 2017-01-13 LAB — AMYLASE: Amylase: 44 U/L (ref 21–101)

## 2017-01-13 LAB — CK: CK TOTAL: 241 U/L — AB (ref 44–196)

## 2017-01-19 ENCOUNTER — Encounter (INDEPENDENT_AMBULATORY_CARE_PROVIDER_SITE_OTHER): Payer: Federal, State, Local not specified - PPO | Admitting: *Deleted

## 2017-01-19 VITALS — BP 106/73 | HR 84 | Temp 98.0°F | Resp 16 | Wt 171.8 lb

## 2017-01-19 DIAGNOSIS — Z006 Encounter for examination for normal comparison and control in clinical research program: Secondary | ICD-10-CM

## 2017-01-19 LAB — CBC WITH DIFFERENTIAL/PLATELET
BASOS ABS: 0 {cells}/uL (ref 0–200)
Basophils Relative: 0 %
EOS PCT: 3 %
Eosinophils Absolute: 171 cells/uL (ref 15–500)
HCT: 42.7 % (ref 38.5–50.0)
Hemoglobin: 14.5 g/dL (ref 13.2–17.1)
Lymphocytes Relative: 43 %
Lymphs Abs: 2451 cells/uL (ref 850–3900)
MCH: 30.2 pg (ref 27.0–33.0)
MCHC: 34 g/dL (ref 32.0–36.0)
MCV: 89 fL (ref 80.0–100.0)
MONOS PCT: 8 %
MPV: 9.6 fL (ref 7.5–12.5)
Monocytes Absolute: 456 cells/uL (ref 200–950)
NEUTROS ABS: 2622 {cells}/uL (ref 1500–7800)
NEUTROS PCT: 46 %
PLATELETS: 293 10*3/uL (ref 140–400)
RBC: 4.8 MIL/uL (ref 4.20–5.80)
RDW: 12.4 % (ref 11.0–15.0)
WBC: 5.7 10*3/uL (ref 3.8–10.8)

## 2017-01-19 NOTE — Progress Notes (Signed)
Adam Murphy is here for his week 10 visit for Study: A Phase 2b/3 Double Blind Safety and Efficacy Study of Injectable Cabotegravir compared to Daily Oral Tenofovir Disoproxil Fumarate/Emtricitabine (TDF/FTC), For Pre-Exposure Prophylaxis in HIV-Uninfected Cisgender Men and Transgender Women who have sex with Men.  Medication: Investigational Injectable Cabotegravir/placebo compared to Truvada/placebo. Duration: Around 4 years.  He says he had some mild soreness after his last injection that lasted a few days and has resolved. He did take some tylenol for it. His nasal drainage has stopped. He denies any other problems.He will be returning on 10/2 for the next injection.

## 2017-01-20 LAB — COMPREHENSIVE METABOLIC PANEL
ALK PHOS: 62 U/L (ref 40–115)
ALT: 21 U/L (ref 9–46)
AST: 19 U/L (ref 10–40)
Albumin: 4.2 g/dL (ref 3.6–5.1)
BILIRUBIN TOTAL: 0.4 mg/dL (ref 0.2–1.2)
BUN: 12 mg/dL (ref 7–25)
CALCIUM: 9 mg/dL (ref 8.6–10.3)
CO2: 25 mmol/L (ref 20–32)
Chloride: 102 mmol/L (ref 98–110)
Creat: 1.38 mg/dL — ABNORMAL HIGH (ref 0.60–1.35)
GLUCOSE: 85 mg/dL (ref 65–99)
Potassium: 4.4 mmol/L (ref 3.5–5.3)
Sodium: 138 mmol/L (ref 135–146)
TOTAL PROTEIN: 7 g/dL (ref 6.1–8.1)

## 2017-01-20 LAB — LIPASE: Lipase: 7 U/L (ref 7–60)

## 2017-01-20 LAB — HIV ANTIBODY (ROUTINE TESTING W REFLEX): HIV: NONREACTIVE

## 2017-01-20 LAB — AMYLASE: Amylase: 38 U/L (ref 21–101)

## 2017-01-20 LAB — CK: Total CK: 361 U/L — ABNORMAL HIGH (ref 44–196)

## 2017-01-20 LAB — PHOSPHORUS: Phosphorus: 3.4 mg/dL (ref 2.5–4.5)

## 2017-03-09 ENCOUNTER — Encounter (INDEPENDENT_AMBULATORY_CARE_PROVIDER_SITE_OTHER): Payer: Federal, State, Local not specified - PPO | Admitting: *Deleted

## 2017-03-09 VITALS — BP 99/67 | HR 80 | Temp 98.2°F | Wt 172.0 lb

## 2017-03-09 DIAGNOSIS — Z006 Encounter for examination for normal comparison and control in clinical research program: Secondary | ICD-10-CM

## 2017-03-09 NOTE — Progress Notes (Signed)
Adam Murphy is here for his week 17 visit for HPTN. He denies any current problems or new medications. An injection of study medication was given in his left hip after his HIV rapid test was confirmed negative. I instructed him to use an icepak and take tylenol or aleve if he needs to for any Injection site discomfort. He says his adherence is excellent. He will return in 2 weeks for followup.

## 2017-03-10 LAB — COMPREHENSIVE METABOLIC PANEL
AG RATIO: 1.5 (calc) (ref 1.0–2.5)
ALT: 67 U/L — AB (ref 9–46)
AST: 28 U/L (ref 10–40)
Albumin: 4.5 g/dL (ref 3.6–5.1)
Alkaline phosphatase (APISO): 62 U/L (ref 40–115)
BUN: 9 mg/dL (ref 7–25)
CALCIUM: 9.5 mg/dL (ref 8.6–10.3)
CO2: 30 mmol/L (ref 20–32)
Chloride: 102 mmol/L (ref 98–110)
Creat: 1.22 mg/dL (ref 0.60–1.35)
GLUCOSE: 88 mg/dL (ref 65–99)
Globulin: 3 g/dL (calc) (ref 1.9–3.7)
Potassium: 4.5 mmol/L (ref 3.5–5.3)
Sodium: 140 mmol/L (ref 135–146)
Total Bilirubin: 0.6 mg/dL (ref 0.2–1.2)
Total Protein: 7.5 g/dL (ref 6.1–8.1)

## 2017-03-10 LAB — CBC WITH DIFFERENTIAL/PLATELET
Basophils Absolute: 30 cells/uL (ref 0–200)
Basophils Relative: 0.7 %
EOS ABS: 120 {cells}/uL (ref 15–500)
Eosinophils Relative: 2.8 %
HCT: 42.7 % (ref 38.5–50.0)
Hemoglobin: 14.6 g/dL (ref 13.2–17.1)
Lymphs Abs: 2150 cells/uL (ref 850–3900)
MCH: 29.1 pg (ref 27.0–33.0)
MCHC: 34.2 g/dL (ref 32.0–36.0)
MCV: 85.2 fL (ref 80.0–100.0)
MONOS PCT: 9.4 %
MPV: 10 fL (ref 7.5–12.5)
NEUTROS PCT: 37.1 %
Neutro Abs: 1595 cells/uL (ref 1500–7800)
Platelets: 298 10*3/uL (ref 140–400)
RBC: 5.01 10*6/uL (ref 4.20–5.80)
RDW: 12.3 % (ref 11.0–15.0)
TOTAL LYMPHOCYTE: 50 %
WBC: 4.3 10*3/uL (ref 3.8–10.8)
WBCMIX: 404 {cells}/uL (ref 200–950)

## 2017-03-10 LAB — HIV ANTIBODY (ROUTINE TESTING W REFLEX): HIV: NONREACTIVE

## 2017-03-10 LAB — PHOSPHORUS: PHOSPHORUS: 3.5 mg/dL (ref 2.5–4.5)

## 2017-03-10 LAB — LIPASE: Lipase: 14 U/L (ref 7–60)

## 2017-03-10 LAB — AMYLASE: Amylase: 44 U/L (ref 21–101)

## 2017-03-10 LAB — CK: Total CK: 240 U/L — ABNORMAL HIGH (ref 44–196)

## 2017-03-23 ENCOUNTER — Encounter (INDEPENDENT_AMBULATORY_CARE_PROVIDER_SITE_OTHER): Payer: Federal, State, Local not specified - PPO | Admitting: *Deleted

## 2017-03-23 VITALS — BP 105/72 | HR 80 | Temp 98.2°F | Wt 171.5 lb

## 2017-03-23 DIAGNOSIS — Z006 Encounter for examination for normal comparison and control in clinical research program: Secondary | ICD-10-CM

## 2017-03-23 NOTE — Progress Notes (Signed)
Adam Murphy was here for his week 19 followup visit for HPTN. He denies any new problems, though he did have about 4 days of tenderness at his injection site along with a small nodule. He said he took tylenol for the pain. He says his adherence is good. He and his partner got married recently. He is to return in 6 weeks for the next visit.

## 2017-03-24 LAB — COMPREHENSIVE METABOLIC PANEL
AG RATIO: 1.5 (calc) (ref 1.0–2.5)
ALT: 30 U/L (ref 9–46)
AST: 18 U/L (ref 10–40)
Albumin: 4.4 g/dL (ref 3.6–5.1)
Alkaline phosphatase (APISO): 58 U/L (ref 40–115)
BUN: 12 mg/dL (ref 7–25)
CALCIUM: 9.5 mg/dL (ref 8.6–10.3)
CO2: 29 mmol/L (ref 20–32)
CREATININE: 1.16 mg/dL (ref 0.60–1.35)
Chloride: 101 mmol/L (ref 98–110)
GLUCOSE: 80 mg/dL (ref 65–99)
Globulin: 2.9 g/dL (calc) (ref 1.9–3.7)
Potassium: 4.5 mmol/L (ref 3.5–5.3)
SODIUM: 138 mmol/L (ref 135–146)
TOTAL PROTEIN: 7.3 g/dL (ref 6.1–8.1)
Total Bilirubin: 0.6 mg/dL (ref 0.2–1.2)

## 2017-03-24 LAB — CBC WITH DIFFERENTIAL/PLATELET
BASOS ABS: 31 {cells}/uL (ref 0–200)
Basophils Relative: 0.6 %
EOS PCT: 2.3 %
Eosinophils Absolute: 117 cells/uL (ref 15–500)
HCT: 41.8 % (ref 38.5–50.0)
HEMOGLOBIN: 14.4 g/dL (ref 13.2–17.1)
Lymphs Abs: 2244 cells/uL (ref 850–3900)
MCH: 28.7 pg (ref 27.0–33.0)
MCHC: 34.4 g/dL (ref 32.0–36.0)
MCV: 83.4 fL (ref 80.0–100.0)
MONOS PCT: 8 %
MPV: 10.2 fL (ref 7.5–12.5)
NEUTROS ABS: 2300 {cells}/uL (ref 1500–7800)
Neutrophils Relative %: 45.1 %
PLATELETS: 316 10*3/uL (ref 140–400)
RBC: 5.01 10*6/uL (ref 4.20–5.80)
RDW: 12.4 % (ref 11.0–15.0)
TOTAL LYMPHOCYTE: 44 %
WBC mixed population: 408 cells/uL (ref 200–950)
WBC: 5.1 10*3/uL (ref 3.8–10.8)

## 2017-03-24 LAB — LIPASE: LIPASE: 9 U/L (ref 7–60)

## 2017-03-24 LAB — PHOSPHORUS: Phosphorus: 3.7 mg/dL (ref 2.5–4.5)

## 2017-03-24 LAB — AMYLASE: Amylase: 40 U/L (ref 21–101)

## 2017-03-24 LAB — CK: CK TOTAL: 276 U/L — AB (ref 44–196)

## 2017-03-24 LAB — HIV ANTIBODY (ROUTINE TESTING W REFLEX): HIV 1&2 Ab, 4th Generation: NONREACTIVE

## 2017-05-04 ENCOUNTER — Encounter (INDEPENDENT_AMBULATORY_CARE_PROVIDER_SITE_OTHER): Payer: Federal, State, Local not specified - PPO | Admitting: *Deleted

## 2017-05-04 VITALS — BP 115/77 | HR 77 | Temp 98.0°F | Wt 176.0 lb

## 2017-05-04 DIAGNOSIS — Z006 Encounter for examination for normal comparison and control in clinical research program: Secondary | ICD-10-CM

## 2017-05-05 LAB — CBC WITH DIFFERENTIAL/PLATELET
BASOS ABS: 20 {cells}/uL (ref 0–200)
Basophils Relative: 0.4 %
EOS ABS: 138 {cells}/uL (ref 15–500)
Eosinophils Relative: 2.7 %
HCT: 41.7 % (ref 38.5–50.0)
HEMOGLOBIN: 14.4 g/dL (ref 13.2–17.1)
Lymphs Abs: 1979 cells/uL (ref 850–3900)
MCH: 29 pg (ref 27.0–33.0)
MCHC: 34.5 g/dL (ref 32.0–36.0)
MCV: 84.1 fL (ref 80.0–100.0)
MONOS PCT: 7.6 %
MPV: 10.6 fL (ref 7.5–12.5)
Neutro Abs: 2576 cells/uL (ref 1500–7800)
Neutrophils Relative %: 50.5 %
PLATELETS: 293 10*3/uL (ref 140–400)
RBC: 4.96 10*6/uL (ref 4.20–5.80)
RDW: 12.9 % (ref 11.0–15.0)
TOTAL LYMPHOCYTE: 38.8 %
WBC mixed population: 388 cells/uL (ref 200–950)
WBC: 5.1 10*3/uL (ref 3.8–10.8)

## 2017-05-05 LAB — AMYLASE: Amylase: 40 U/L (ref 21–101)

## 2017-05-05 LAB — COMPREHENSIVE METABOLIC PANEL
AG Ratio: 1.4 (calc) (ref 1.0–2.5)
ALKALINE PHOSPHATASE (APISO): 60 U/L (ref 40–115)
ALT: 20 U/L (ref 9–46)
AST: 19 U/L (ref 10–40)
Albumin: 4.3 g/dL (ref 3.6–5.1)
BILIRUBIN TOTAL: 0.4 mg/dL (ref 0.2–1.2)
BUN: 8 mg/dL (ref 7–25)
CALCIUM: 9.4 mg/dL (ref 8.6–10.3)
CHLORIDE: 103 mmol/L (ref 98–110)
CO2: 27 mmol/L (ref 20–32)
CREATININE: 1.04 mg/dL (ref 0.60–1.35)
GLOBULIN: 3 g/dL (ref 1.9–3.7)
Glucose, Bld: 90 mg/dL (ref 65–99)
Potassium: 4.3 mmol/L (ref 3.5–5.3)
Sodium: 137 mmol/L (ref 135–146)
Total Protein: 7.3 g/dL (ref 6.1–8.1)

## 2017-05-05 LAB — CK: CK TOTAL: 299 U/L — AB (ref 44–196)

## 2017-05-05 LAB — LIPASE: LIPASE: 11 U/L (ref 7–60)

## 2017-05-05 LAB — HIV ANTIBODY (ROUTINE TESTING W REFLEX): HIV: NONREACTIVE

## 2017-05-05 LAB — PHOSPHORUS: Phosphorus: 2.8 mg/dL (ref 2.5–4.5)

## 2017-05-05 NOTE — Progress Notes (Signed)
Study: A Phase 2b/3 Double Blind Safety and Efficacy Study of Injectable Cabotegravir compared to Daily Oral Tenofovir Disoproxil Fumarate/Emtricitabine (TDF/FTC), For Pre-Exposure Prophylaxis in HIV-Uninfected Cisgender Men and Transgender Women who have sex with Men.  Medication: Investigational Injectable Cabotegravir/placebo compared to Truvada/placebo. Duration: Around 4 years.  Adam Murphy is here for week 25. He had conjunctivitis and PND Tx with abx and has resolved. No other issues to report. He returned # 34 pills of oral study product. Questionnaires completed. HIV rapid confirmed non-reactive. Injection given in left gluteal muscle with no problems. Oral study product dispensed. He received $50 gift card for visit. Will see him next week for follow up.

## 2017-05-11 ENCOUNTER — Encounter (INDEPENDENT_AMBULATORY_CARE_PROVIDER_SITE_OTHER): Payer: Federal, State, Local not specified - PPO | Admitting: *Deleted

## 2017-05-11 VITALS — BP 113/78 | HR 76 | Temp 98.5°F | Wt 176.5 lb

## 2017-05-11 DIAGNOSIS — Z006 Encounter for examination for normal comparison and control in clinical research program: Secondary | ICD-10-CM

## 2017-05-11 NOTE — Progress Notes (Signed)
Adam Murphy is here for his week 27 visit [facility]. He did develop some injection site soreness and a small knot at the site, 2 days after the injection. He denies any other problems. He said he and his partner got tested for STDs recently and they were both negative. He will be returning in January for the next injection.

## 2017-05-12 LAB — COMPREHENSIVE METABOLIC PANEL
AG Ratio: 1.4 (calc) (ref 1.0–2.5)
ALBUMIN MSPROF: 4.2 g/dL (ref 3.6–5.1)
ALKALINE PHOSPHATASE (APISO): 66 U/L (ref 40–115)
ALT: 19 U/L (ref 9–46)
AST: 16 U/L (ref 10–40)
BILIRUBIN TOTAL: 0.4 mg/dL (ref 0.2–1.2)
BUN: 10 mg/dL (ref 7–25)
CALCIUM: 9.2 mg/dL (ref 8.6–10.3)
CO2: 28 mmol/L (ref 20–32)
Chloride: 103 mmol/L (ref 98–110)
Creat: 1.17 mg/dL (ref 0.60–1.35)
Globulin: 3.1 g/dL (calc) (ref 1.9–3.7)
Glucose, Bld: 92 mg/dL (ref 65–99)
POTASSIUM: 4.4 mmol/L (ref 3.5–5.3)
Sodium: 138 mmol/L (ref 135–146)
Total Protein: 7.3 g/dL (ref 6.1–8.1)

## 2017-05-12 LAB — CBC WITH DIFFERENTIAL/PLATELET
BASOS ABS: 32 {cells}/uL (ref 0–200)
Basophils Relative: 0.6 %
Eosinophils Absolute: 178 cells/uL (ref 15–500)
Eosinophils Relative: 3.3 %
HEMATOCRIT: 43.4 % (ref 38.5–50.0)
HEMOGLOBIN: 14.5 g/dL (ref 13.2–17.1)
LYMPHS ABS: 2543 {cells}/uL (ref 850–3900)
MCH: 28.4 pg (ref 27.0–33.0)
MCHC: 33.4 g/dL (ref 32.0–36.0)
MCV: 84.9 fL (ref 80.0–100.0)
MPV: 10.5 fL (ref 7.5–12.5)
Monocytes Relative: 7.9 %
NEUTROS ABS: 2219 {cells}/uL (ref 1500–7800)
NEUTROS PCT: 41.1 %
Platelets: 271 10*3/uL (ref 140–400)
RBC: 5.11 10*6/uL (ref 4.20–5.80)
RDW: 12.6 % (ref 11.0–15.0)
Total Lymphocyte: 47.1 %
WBC: 5.4 10*3/uL (ref 3.8–10.8)
WBCMIX: 427 {cells}/uL (ref 200–950)

## 2017-05-12 LAB — PHOSPHORUS: Phosphorus: 3.3 mg/dL (ref 2.5–4.5)

## 2017-05-12 LAB — CK: Total CK: 193 U/L (ref 44–196)

## 2017-05-12 LAB — LIPASE: LIPASE: 9 U/L (ref 7–60)

## 2017-05-12 LAB — AMYLASE: AMYLASE: 39 U/L (ref 21–101)

## 2017-05-12 LAB — HIV ANTIBODY (ROUTINE TESTING W REFLEX): HIV 1&2 Ab, 4th Generation: NONREACTIVE

## 2017-06-22 ENCOUNTER — Encounter (INDEPENDENT_AMBULATORY_CARE_PROVIDER_SITE_OTHER): Payer: Federal, State, Local not specified - PPO | Admitting: *Deleted

## 2017-06-22 VITALS — BP 120/72 | HR 80 | Temp 98.4°F | Wt 181.0 lb

## 2017-06-22 DIAGNOSIS — Z006 Encounter for examination for normal comparison and control in clinical research program: Secondary | ICD-10-CM

## 2017-06-22 NOTE — Progress Notes (Signed)
Study: A Phase 2b/3 Double Blind Safety and Efficacy Study of Injectable Cabotegravir compared to Daily Oral Tenofovir Disoproxil Fumarate/Emtricitabine (TDF/FTC), For Pre-Exposure Prophylaxis in HIV-Uninfected Cisgender Men and Transgender Women who have sex with Men.  Medication: Investigational Injectable Cabotegravir/placebo compared to Truvada/placebo. Duration: Around 4 years.  Adam Murphy is here for week 33. He denies any new issues. Assessment unchanged. HIV rapid confirmed non-reactive. He returned #41 pills. Injection given in left gluteal muscle. Oral study product dispensed. He received $50 gift card for visit. Next appointment schedule for 1/29

## 2017-06-23 LAB — COMPREHENSIVE METABOLIC PANEL
AG Ratio: 1.5 (calc) (ref 1.0–2.5)
ALBUMIN MSPROF: 4.6 g/dL (ref 3.6–5.1)
ALT: 18 U/L (ref 9–46)
AST: 16 U/L (ref 10–40)
Alkaline phosphatase (APISO): 60 U/L (ref 40–115)
BILIRUBIN TOTAL: 0.4 mg/dL (ref 0.2–1.2)
BUN: 10 mg/dL (ref 7–25)
CALCIUM: 9.4 mg/dL (ref 8.6–10.3)
CO2: 30 mmol/L (ref 20–32)
Chloride: 103 mmol/L (ref 98–110)
Creat: 1.09 mg/dL (ref 0.60–1.35)
Globulin: 3 g/dL (calc) (ref 1.9–3.7)
Glucose, Bld: 82 mg/dL (ref 65–99)
POTASSIUM: 4.3 mmol/L (ref 3.5–5.3)
Sodium: 138 mmol/L (ref 135–146)
Total Protein: 7.6 g/dL (ref 6.1–8.1)

## 2017-06-23 LAB — CBC WITH DIFFERENTIAL/PLATELET
Basophils Absolute: 30 cells/uL (ref 0–200)
Basophils Relative: 0.7 %
EOS ABS: 129 {cells}/uL (ref 15–500)
Eosinophils Relative: 3 %
HCT: 43.8 % (ref 38.5–50.0)
Hemoglobin: 14.9 g/dL (ref 13.2–17.1)
Lymphs Abs: 1853 cells/uL (ref 850–3900)
MCH: 28.9 pg (ref 27.0–33.0)
MCHC: 34 g/dL (ref 32.0–36.0)
MCV: 84.9 fL (ref 80.0–100.0)
MONOS PCT: 9.8 %
MPV: 10.5 fL (ref 7.5–12.5)
NEUTROS PCT: 43.4 %
Neutro Abs: 1866 cells/uL (ref 1500–7800)
PLATELETS: 297 10*3/uL (ref 140–400)
RBC: 5.16 10*6/uL (ref 4.20–5.80)
RDW: 12.7 % (ref 11.0–15.0)
TOTAL LYMPHOCYTE: 43.1 %
WBC: 4.3 10*3/uL (ref 3.8–10.8)
WBCMIX: 421 {cells}/uL (ref 200–950)

## 2017-06-23 LAB — C. TRACHOMATIS/N. GONORRHOEAE RNA
C. trachomatis RNA, TMA: NOT DETECTED
N. GONORRHOEAE RNA, TMA: NOT DETECTED

## 2017-06-23 LAB — PHOSPHORUS: PHOSPHORUS: 3.1 mg/dL (ref 2.5–4.5)

## 2017-06-23 LAB — LIPASE: Lipase: 12 U/L (ref 7–60)

## 2017-06-23 LAB — AMYLASE: Amylase: 40 U/L (ref 21–101)

## 2017-06-23 LAB — RPR: RPR: NONREACTIVE

## 2017-06-23 LAB — CK: CK TOTAL: 231 U/L — AB (ref 44–196)

## 2017-06-23 LAB — HIV ANTIBODY (ROUTINE TESTING W REFLEX): HIV: NONREACTIVE

## 2017-06-25 ENCOUNTER — Telehealth: Payer: Self-pay | Admitting: Infectious Disease

## 2017-06-25 LAB — CT/NG RNA, TMA RECTAL
Chlamydia Trachomatis RNA: NOT DETECTED
Neisseria Gonorrhoeae RNA: NOT DETECTED

## 2017-06-25 MED ORDER — DOXYCYCLINE HYCLATE 100 MG PO TABS: 100.0000 mg | ORAL_TABLET | Freq: Two times a day (BID) | ORAL | 0 refills | Status: AC

## 2017-06-25 NOTE — Addendum Note (Signed)
Addended by: Nicolasa DuckingEPPERSON, Shelbey Spindler S on: 06/25/2017 02:28 PM   Modules accepted: Orders

## 2017-06-25 NOTE — Telephone Encounter (Addendum)
Study: A Phase 2b/3 Double Blind Safety and Efficacy Study of Injectable Cabotegravir compared to Daily Oral Tenofovir Disoproxil Fumarate/Emtricitabine (TDF/FTC), For Pre-Exposure Prophylaxis in HIV-Uninfected Cisgender Men and Transgender Women who have sex with Men.  Medication: Investigational Injectable Cabotegravir/placebo compared to Truvada/placebo. Duration: Around 4 years.  Adam Murphy called for results for STI. All were negative. He was surprised - states he has been waking up with milky penile discharge since 06/22/2017. He denies any itching or pain. He feels he may have UTI?? Told him I would consult Dr. Daiva EvesVan Dam to see if he recommends any treatment. Please advise.  [facility] [address] Adam Batten- Kim Murphy could you please take care of this if Dr. Daiva EvesVan Dam does prescribe something and call participant. Thanks - Adam Murphy  Addendum: Spoke with Adam BattenKim and this has been happening off and on since the beginning of study. This is not a medical issue and needs no treatment unless Dr. Daiva EvesVan Dam feels otherwise.

## 2017-06-25 NOTE — Telephone Encounter (Signed)
Spoke with Dr. Daiva EvesVan Dam and will treat him with Doxy 100mg  PO BID x 7 days to see if symptoms improve. Will call into pharmacy and notify participant.

## 2017-06-25 NOTE — Telephone Encounter (Signed)
Patient called about results and advised will have Research call him with his results.

## 2017-07-06 ENCOUNTER — Encounter (INDEPENDENT_AMBULATORY_CARE_PROVIDER_SITE_OTHER): Payer: Self-pay | Admitting: *Deleted

## 2017-07-06 VITALS — BP 104/69 | HR 75 | Temp 98.1°F | Wt 183.2 lb

## 2017-07-06 DIAGNOSIS — Z006 Encounter for examination for normal comparison and control in clinical research program: Secondary | ICD-10-CM

## 2017-07-06 NOTE — Progress Notes (Signed)
Adam Murphy is here for his week 35 study visit fro HPTN. He said his injection at the last visit was only tender for about 1 day afterward. He denies any other problems or concerns. He will be returning in March for the next injection.

## 2017-07-07 LAB — CBC WITH DIFFERENTIAL/PLATELET
BASOS ABS: 30 {cells}/uL (ref 0–200)
Basophils Relative: 0.7 %
EOS ABS: 129 {cells}/uL (ref 15–500)
Eosinophils Relative: 3 %
HEMATOCRIT: 40.3 % (ref 38.5–50.0)
HEMOGLOBIN: 14 g/dL (ref 13.2–17.1)
Lymphs Abs: 2227 cells/uL (ref 850–3900)
MCH: 29 pg (ref 27.0–33.0)
MCHC: 34.7 g/dL (ref 32.0–36.0)
MCV: 83.6 fL (ref 80.0–100.0)
MPV: 10.4 fL (ref 7.5–12.5)
Monocytes Relative: 9.4 %
NEUTROS ABS: 1509 {cells}/uL (ref 1500–7800)
Neutrophils Relative %: 35.1 %
Platelets: 299 10*3/uL (ref 140–400)
RBC: 4.82 10*6/uL (ref 4.20–5.80)
RDW: 12.6 % (ref 11.0–15.0)
Total Lymphocyte: 51.8 %
WBC mixed population: 404 cells/uL (ref 200–950)
WBC: 4.3 10*3/uL (ref 3.8–10.8)

## 2017-07-07 LAB — COMPREHENSIVE METABOLIC PANEL
AG Ratio: 1.5 (calc) (ref 1.0–2.5)
ALT: 22 U/L (ref 9–46)
AST: 26 U/L (ref 10–40)
Albumin: 4.4 g/dL (ref 3.6–5.1)
Alkaline phosphatase (APISO): 60 U/L (ref 40–115)
BUN: 11 mg/dL (ref 7–25)
CO2: 27 mmol/L (ref 20–32)
CREATININE: 1.01 mg/dL (ref 0.60–1.35)
Calcium: 9.4 mg/dL (ref 8.6–10.3)
Chloride: 103 mmol/L (ref 98–110)
GLUCOSE: 76 mg/dL (ref 65–99)
Globulin: 2.9 g/dL (calc) (ref 1.9–3.7)
Potassium: 4.3 mmol/L (ref 3.5–5.3)
SODIUM: 138 mmol/L (ref 135–146)
TOTAL PROTEIN: 7.3 g/dL (ref 6.1–8.1)
Total Bilirubin: 0.5 mg/dL (ref 0.2–1.2)

## 2017-07-07 LAB — LIPASE: Lipase: 15 U/L (ref 7–60)

## 2017-07-07 LAB — PHOSPHORUS: PHOSPHORUS: 3.6 mg/dL (ref 2.5–4.5)

## 2017-07-07 LAB — AMYLASE: AMYLASE: 44 U/L (ref 21–101)

## 2017-07-07 LAB — CK: CK TOTAL: 704 U/L — AB (ref 44–196)

## 2017-07-07 LAB — HIV ANTIBODY (ROUTINE TESTING W REFLEX): HIV: NONREACTIVE

## 2017-08-17 ENCOUNTER — Encounter (INDEPENDENT_AMBULATORY_CARE_PROVIDER_SITE_OTHER): Payer: Self-pay | Admitting: *Deleted

## 2017-08-17 VITALS — BP 100/60 | HR 70 | Temp 98.2°F | Wt 178.0 lb

## 2017-08-17 DIAGNOSIS — Z006 Encounter for examination for normal comparison and control in clinical research program: Secondary | ICD-10-CM

## 2017-08-17 NOTE — Progress Notes (Signed)
Study: A Phase 2b/3 Double Blind Safety and Efficacy Study of Injectable Cabotegravir compared to Daily Oral Tenofovir Disoproxil Fumarate/Emtricitabine (TDF/FTC), For Pre-Exposure Prophylaxis in HIV-Uninfected Cisgender Men and Transgender Women who have sex with Men.  Medication: Investigational Injectable Cabotegravir/placebo compared to Truvada/placebo. Duration: Around 4 years.  Romeo AppleHarrison is here for week 41 visit. States that he went to see a urologist last week regarding continued penile discharge. Treated with metronidazole and erythromycin which he completed today. States that discharge is still present. Plans to follow-up with urologist. His adherence is 100% with his oral study medication. Rapid HIV non-reactive. Cabotegravir/placebo injection given (R) buttock. Site unremarkable. He will return in 2 weeks for his next study visit.

## 2017-08-18 LAB — COMPREHENSIVE METABOLIC PANEL
AG RATIO: 1.6 (calc) (ref 1.0–2.5)
ALT: 24 U/L (ref 9–46)
AST: 21 U/L (ref 10–40)
Albumin: 4.6 g/dL (ref 3.6–5.1)
Alkaline phosphatase (APISO): 55 U/L (ref 40–115)
BUN: 12 mg/dL (ref 7–25)
CHLORIDE: 102 mmol/L (ref 98–110)
CO2: 30 mmol/L (ref 20–32)
Calcium: 9.8 mg/dL (ref 8.6–10.3)
Creat: 1.22 mg/dL (ref 0.60–1.35)
GLOBULIN: 2.8 g/dL (ref 1.9–3.7)
GLUCOSE: 94 mg/dL (ref 65–99)
Potassium: 4.7 mmol/L (ref 3.5–5.3)
Sodium: 138 mmol/L (ref 135–146)
TOTAL PROTEIN: 7.4 g/dL (ref 6.1–8.1)
Total Bilirubin: 0.8 mg/dL (ref 0.2–1.2)

## 2017-08-18 LAB — CBC WITH DIFFERENTIAL/PLATELET
BASOS PCT: 0.5 %
Basophils Absolute: 21 cells/uL (ref 0–200)
EOS ABS: 103 {cells}/uL (ref 15–500)
EOS PCT: 2.5 %
HCT: 44.2 % (ref 38.5–50.0)
HEMOGLOBIN: 15.3 g/dL (ref 13.2–17.1)
Lymphs Abs: 1923 cells/uL (ref 850–3900)
MCH: 29.1 pg (ref 27.0–33.0)
MCHC: 34.6 g/dL (ref 32.0–36.0)
MCV: 84.2 fL (ref 80.0–100.0)
MONOS PCT: 6.9 %
MPV: 10.4 fL (ref 7.5–12.5)
NEUTROS ABS: 1771 {cells}/uL (ref 1500–7800)
Neutrophils Relative %: 43.2 %
Platelets: 284 10*3/uL (ref 140–400)
RBC: 5.25 10*6/uL (ref 4.20–5.80)
RDW: 12.6 % (ref 11.0–15.0)
Total Lymphocyte: 46.9 %
WBC mixed population: 283 cells/uL (ref 200–950)
WBC: 4.1 10*3/uL (ref 3.8–10.8)

## 2017-08-18 LAB — HIV ANTIBODY (ROUTINE TESTING W REFLEX): HIV 1&2 Ab, 4th Generation: NONREACTIVE

## 2017-08-18 LAB — CK: Total CK: 314 U/L — ABNORMAL HIGH (ref 44–196)

## 2017-08-18 LAB — AMYLASE: Amylase: 44 U/L (ref 21–101)

## 2017-08-18 LAB — LIPASE: Lipase: 12 U/L (ref 7–60)

## 2017-08-18 LAB — PHOSPHORUS: Phosphorus: 2.9 mg/dL (ref 2.5–4.5)

## 2017-08-31 ENCOUNTER — Encounter (INDEPENDENT_AMBULATORY_CARE_PROVIDER_SITE_OTHER): Payer: Managed Care, Other (non HMO) | Admitting: *Deleted

## 2017-08-31 VITALS — BP 106/69 | HR 84 | Temp 98.3°F | Wt 175.5 lb

## 2017-08-31 DIAGNOSIS — Z006 Encounter for examination for normal comparison and control in clinical research program: Secondary | ICD-10-CM

## 2017-08-31 LAB — CBC WITH DIFFERENTIAL/PLATELET
Basophils Absolute: 21 cells/uL (ref 0–200)
Basophils Relative: 0.4 %
EOS ABS: 111 {cells}/uL (ref 15–500)
Eosinophils Relative: 2.1 %
HEMATOCRIT: 41.7 % (ref 38.5–50.0)
HEMOGLOBIN: 14.4 g/dL (ref 13.2–17.1)
Lymphs Abs: 1988 cells/uL (ref 850–3900)
MCH: 29.2 pg (ref 27.0–33.0)
MCHC: 34.5 g/dL (ref 32.0–36.0)
MCV: 84.6 fL (ref 80.0–100.0)
MONOS PCT: 8.9 %
MPV: 10.5 fL (ref 7.5–12.5)
NEUTROS ABS: 2708 {cells}/uL (ref 1500–7800)
Neutrophils Relative %: 51.1 %
Platelets: 291 10*3/uL (ref 140–400)
RBC: 4.93 10*6/uL (ref 4.20–5.80)
RDW: 12.2 % (ref 11.0–15.0)
Total Lymphocyte: 37.5 %
WBC mixed population: 472 cells/uL (ref 200–950)
WBC: 5.3 10*3/uL (ref 3.8–10.8)

## 2017-08-31 NOTE — Progress Notes (Signed)
Adam Murphy is here for his week 43 followup visit for HPTN. He said that he had about 4 days of injection site soreness after his injection a few weeks ago. He said his penile discharge came back after [facility] with the erythromycin and metronidazole worse than before. They then did a cystocopy and swabbed him for gonorrhea. This time it came back positive. He was treated yesterday with ceftriaxone and azithromycin.  He said that they told him his urethra and bladder were in good shape. The urine test done at the urologist's office was negative prior to the swab collection. He is to let us know if the discharge stops or gets worse. He doesn't have any burning on urination.  He will be returning for another injection on 10/12/17.

## 2017-09-01 LAB — COMPREHENSIVE METABOLIC PANEL
AG RATIO: 1.7 (calc) (ref 1.0–2.5)
ALKALINE PHOSPHATASE (APISO): 59 U/L (ref 40–115)
ALT: 27 U/L (ref 9–46)
AST: 22 U/L (ref 10–40)
Albumin: 4.5 g/dL (ref 3.6–5.1)
BILIRUBIN TOTAL: 0.7 mg/dL (ref 0.2–1.2)
BUN: 9 mg/dL (ref 7–25)
CALCIUM: 9.5 mg/dL (ref 8.6–10.3)
CO2: 28 mmol/L (ref 20–32)
Chloride: 103 mmol/L (ref 98–110)
Creat: 1.21 mg/dL (ref 0.60–1.35)
GLUCOSE: 89 mg/dL (ref 65–99)
Globulin: 2.7 g/dL (calc) (ref 1.9–3.7)
Potassium: 4.7 mmol/L (ref 3.5–5.3)
Sodium: 138 mmol/L (ref 135–146)
Total Protein: 7.2 g/dL (ref 6.1–8.1)

## 2017-09-01 LAB — LIPASE: LIPASE: 9 U/L (ref 7–60)

## 2017-09-01 LAB — AMYLASE: AMYLASE: 39 U/L (ref 21–101)

## 2017-09-01 LAB — CK: Total CK: 423 U/L — ABNORMAL HIGH (ref 44–196)

## 2017-09-01 LAB — HIV ANTIBODY (ROUTINE TESTING W REFLEX): HIV: NONREACTIVE

## 2017-09-01 LAB — PHOSPHORUS: Phosphorus: 3.4 mg/dL (ref 2.5–4.5)

## 2017-10-12 ENCOUNTER — Encounter (INDEPENDENT_AMBULATORY_CARE_PROVIDER_SITE_OTHER): Payer: Managed Care, Other (non HMO) | Admitting: *Deleted

## 2017-10-12 VITALS — BP 100/67 | HR 83 | Temp 97.9°F | Wt 171.5 lb

## 2017-10-12 DIAGNOSIS — Z006 Encounter for examination for normal comparison and control in clinical research program: Secondary | ICD-10-CM

## 2017-10-13 LAB — CBC WITH DIFFERENTIAL/PLATELET
BASOS ABS: 22 {cells}/uL (ref 0–200)
BASOS PCT: 0.4 %
EOS PCT: 3.1 %
Eosinophils Absolute: 171 cells/uL (ref 15–500)
HCT: 43.3 % (ref 38.5–50.0)
HEMOGLOBIN: 15 g/dL (ref 13.2–17.1)
Lymphs Abs: 2101 cells/uL (ref 850–3900)
MCH: 29.8 pg (ref 27.0–33.0)
MCHC: 34.6 g/dL (ref 32.0–36.0)
MCV: 85.9 fL (ref 80.0–100.0)
MONOS PCT: 7.8 %
MPV: 10.9 fL (ref 7.5–12.5)
Neutro Abs: 2778 cells/uL (ref 1500–7800)
Neutrophils Relative %: 50.5 %
Platelets: 265 10*3/uL (ref 140–400)
RBC: 5.04 10*6/uL (ref 4.20–5.80)
RDW: 12.4 % (ref 11.0–15.0)
Total Lymphocyte: 38.2 %
WBC mixed population: 429 cells/uL (ref 200–950)
WBC: 5.5 10*3/uL (ref 3.8–10.8)

## 2017-10-13 LAB — COMPREHENSIVE METABOLIC PANEL
AG Ratio: 1.5 (calc) (ref 1.0–2.5)
ALBUMIN MSPROF: 4.5 g/dL (ref 3.6–5.1)
ALKALINE PHOSPHATASE (APISO): 60 U/L (ref 40–115)
ALT: 25 U/L (ref 9–46)
AST: 18 U/L (ref 10–40)
BILIRUBIN TOTAL: 0.8 mg/dL (ref 0.2–1.2)
BUN: 9 mg/dL (ref 7–25)
CALCIUM: 9.5 mg/dL (ref 8.6–10.3)
CO2: 30 mmol/L (ref 20–32)
Chloride: 102 mmol/L (ref 98–110)
Creat: 1.1 mg/dL (ref 0.60–1.35)
Globulin: 3 g/dL (calc) (ref 1.9–3.7)
Glucose, Bld: 82 mg/dL (ref 65–99)
POTASSIUM: 4.4 mmol/L (ref 3.5–5.3)
Sodium: 139 mmol/L (ref 135–146)
Total Protein: 7.5 g/dL (ref 6.1–8.1)

## 2017-10-13 LAB — HIV ANTIBODY (ROUTINE TESTING W REFLEX): HIV 1&2 Ab, 4th Generation: NONREACTIVE

## 2017-10-13 LAB — PHOSPHORUS: PHOSPHORUS: 3.1 mg/dL (ref 2.5–4.5)

## 2017-10-13 LAB — AMYLASE: AMYLASE: 45 U/L (ref 21–101)

## 2017-10-13 LAB — CK: Total CK: 148 U/L (ref 44–196)

## 2017-10-13 LAB — LIPASE: Lipase: 18 U/L (ref 7–60)

## 2017-10-13 NOTE — Progress Notes (Signed)
Adam Murphy was here for his injection visit for [facility] 083. He denies any new problems or concerns. His adherence has been good. An injection was given in his rt buttock without problem and he will return in 2 weeks.

## 2017-10-26 ENCOUNTER — Encounter (INDEPENDENT_AMBULATORY_CARE_PROVIDER_SITE_OTHER): Payer: Self-pay | Admitting: *Deleted

## 2017-10-26 VITALS — BP 104/70 | HR 73 | Temp 97.8°F | Wt 170.5 lb

## 2017-10-26 DIAGNOSIS — Z006 Encounter for examination for normal comparison and control in clinical research program: Secondary | ICD-10-CM

## 2017-10-26 NOTE — Progress Notes (Signed)
Adam Murphy is here for his week 51 visit for Hptn 083. He says he had pain, warmth and a knot come up from his last injection. He ended up taking some tylenol for it and says the knot is still there. He says his adherence is good. He has noticed some mucus in his stool over the past week or so that comes and goes and denies any other GI sx. He will be returning in July for the next injection.

## 2017-10-27 LAB — COMPREHENSIVE METABOLIC PANEL
AG Ratio: 1.4 (calc) (ref 1.0–2.5)
ALBUMIN MSPROF: 4.2 g/dL (ref 3.6–5.1)
ALT: 19 U/L (ref 9–46)
AST: 29 U/L (ref 10–40)
Alkaline phosphatase (APISO): 55 U/L (ref 40–115)
BUN: 13 mg/dL (ref 7–25)
CO2: 29 mmol/L (ref 20–32)
CREATININE: 1.17 mg/dL (ref 0.60–1.35)
Calcium: 9.4 mg/dL (ref 8.6–10.3)
Chloride: 105 mmol/L (ref 98–110)
GLUCOSE: 87 mg/dL (ref 65–99)
Globulin: 3 g/dL (calc) (ref 1.9–3.7)
Potassium: 4.3 mmol/L (ref 3.5–5.3)
Sodium: 139 mmol/L (ref 135–146)
TOTAL PROTEIN: 7.2 g/dL (ref 6.1–8.1)
Total Bilirubin: 0.6 mg/dL (ref 0.2–1.2)

## 2017-10-27 LAB — CBC WITH DIFFERENTIAL/PLATELET
Basophils Absolute: 28 cells/uL (ref 0–200)
Basophils Relative: 0.5 %
Eosinophils Absolute: 253 cells/uL (ref 15–500)
Eosinophils Relative: 4.6 %
HEMATOCRIT: 42.7 % (ref 38.5–50.0)
Hemoglobin: 14.6 g/dL (ref 13.2–17.1)
LYMPHS ABS: 2305 {cells}/uL (ref 850–3900)
MCH: 29 pg (ref 27.0–33.0)
MCHC: 34.2 g/dL (ref 32.0–36.0)
MCV: 84.9 fL (ref 80.0–100.0)
MPV: 10.4 fL (ref 7.5–12.5)
Monocytes Relative: 7.9 %
NEUTROS PCT: 45.1 %
Neutro Abs: 2481 cells/uL (ref 1500–7800)
PLATELETS: 287 10*3/uL (ref 140–400)
RBC: 5.03 10*6/uL (ref 4.20–5.80)
RDW: 12.5 % (ref 11.0–15.0)
TOTAL LYMPHOCYTE: 41.9 %
WBC mixed population: 435 cells/uL (ref 200–950)
WBC: 5.5 10*3/uL (ref 3.8–10.8)

## 2017-10-27 LAB — AMYLASE: AMYLASE: 41 U/L (ref 21–101)

## 2017-10-27 LAB — HIV ANTIBODY (ROUTINE TESTING W REFLEX): HIV: NONREACTIVE

## 2017-10-27 LAB — PHOSPHORUS: Phosphorus: 3.3 mg/dL (ref 2.5–4.5)

## 2017-10-27 LAB — CK: CK TOTAL: 1506 U/L — AB (ref 44–196)

## 2017-10-27 LAB — LIPASE: LIPASE: 11 U/L (ref 7–60)

## 2017-10-28 NOTE — Progress Notes (Signed)
Protocol did not require repeat analysis as it was only grade 2, patient had been lifting heaving equipment and moving it at a fair per his chart the day or two before the visit. Do you prefer we repeat?

## 2017-12-14 ENCOUNTER — Encounter (INDEPENDENT_AMBULATORY_CARE_PROVIDER_SITE_OTHER): Payer: Self-pay

## 2017-12-14 VITALS — BP 103/71 | HR 67 | Temp 98.3°F | Wt 169.0 lb

## 2017-12-14 DIAGNOSIS — Z006 Encounter for examination for normal comparison and control in clinical research program: Secondary | ICD-10-CM

## 2017-12-14 NOTE — Progress Notes (Signed)
Participant here for [id number] study visit week 57. Fasting lipid panel was omitted and will occur at next visit on 7/17 as participant was not fasting. Rapid HIV non-reactive today. Patient received IM injection of cabotegravir/placebo in right glute at 1311 as well as new dispensing of oral study IP.

## 2017-12-15 LAB — URINALYSIS
Bilirubin Urine: NEGATIVE
Glucose, UA: NEGATIVE
HGB URINE DIPSTICK: NEGATIVE
Ketones, ur: NEGATIVE
LEUKOCYTES UA: NEGATIVE
NITRITE: NEGATIVE
PROTEIN: NEGATIVE
Specific Gravity, Urine: 1.005 (ref 1.001–1.03)
pH: 7 (ref 5.0–8.0)

## 2017-12-15 LAB — CBC WITH DIFFERENTIAL/PLATELET
BASOS PCT: 0.3 %
Basophils Absolute: 12 cells/uL (ref 0–200)
EOS PCT: 1.5 %
Eosinophils Absolute: 59 cells/uL (ref 15–500)
HCT: 41.9 % (ref 38.5–50.0)
HEMOGLOBIN: 14.5 g/dL (ref 13.2–17.1)
Lymphs Abs: 1946 cells/uL (ref 850–3900)
MCH: 29.2 pg (ref 27.0–33.0)
MCHC: 34.6 g/dL (ref 32.0–36.0)
MCV: 84.5 fL (ref 80.0–100.0)
MONOS PCT: 8.7 %
MPV: 10.8 fL (ref 7.5–12.5)
NEUTROS PCT: 39.6 %
Neutro Abs: 1544 cells/uL (ref 1500–7800)
PLATELETS: 269 10*3/uL (ref 140–400)
RBC: 4.96 10*6/uL (ref 4.20–5.80)
RDW: 12.3 % (ref 11.0–15.0)
TOTAL LYMPHOCYTE: 49.9 %
WBC mixed population: 339 cells/uL (ref 200–950)
WBC: 3.9 10*3/uL (ref 3.8–10.8)

## 2017-12-15 LAB — CK: CK TOTAL: 166 U/L (ref 44–196)

## 2017-12-15 LAB — COMPREHENSIVE METABOLIC PANEL
AG Ratio: 1.7 (calc) (ref 1.0–2.5)
ALT: 22 U/L (ref 9–46)
AST: 17 U/L (ref 10–40)
Albumin: 4.7 g/dL (ref 3.6–5.1)
Alkaline phosphatase (APISO): 54 U/L (ref 40–115)
BILIRUBIN TOTAL: 0.6 mg/dL (ref 0.2–1.2)
BUN: 9 mg/dL (ref 7–25)
CALCIUM: 9.4 mg/dL (ref 8.6–10.3)
CO2: 26 mmol/L (ref 20–32)
Chloride: 102 mmol/L (ref 98–110)
Creat: 1.22 mg/dL (ref 0.60–1.35)
Globulin: 2.7 g/dL (calc) (ref 1.9–3.7)
Glucose, Bld: 83 mg/dL (ref 65–99)
Potassium: 4.7 mmol/L (ref 3.5–5.3)
SODIUM: 138 mmol/L (ref 135–146)
TOTAL PROTEIN: 7.4 g/dL (ref 6.1–8.1)

## 2017-12-15 LAB — HEPATITIS C ANTIBODY
HEP C AB: NONREACTIVE
SIGNAL TO CUT-OFF: 0.04 (ref <1.00)

## 2017-12-15 LAB — LIPASE: Lipase: 12 U/L (ref 7–60)

## 2017-12-15 LAB — C. TRACHOMATIS/N. GONORRHOEAE RNA
C. trachomatis RNA, TMA: NOT DETECTED
N. GONORRHOEAE RNA, TMA: NOT DETECTED

## 2017-12-15 LAB — RPR: RPR: NONREACTIVE

## 2017-12-15 LAB — HIV ANTIBODY (ROUTINE TESTING W REFLEX): HIV 1&2 Ab, 4th Generation: NONREACTIVE

## 2017-12-15 LAB — AMYLASE: AMYLASE: 42 U/L (ref 21–101)

## 2017-12-15 LAB — PHOSPHORUS: Phosphorus: 3.2 mg/dL (ref 2.5–4.5)

## 2017-12-16 LAB — CT/NG RNA, TMA RECTAL
Chlamydia Trachomatis RNA: NOT DETECTED
NEISSERIA GONORRHOEAE RNA: NOT DETECTED

## 2017-12-22 ENCOUNTER — Encounter (INDEPENDENT_AMBULATORY_CARE_PROVIDER_SITE_OTHER): Payer: Self-pay | Admitting: *Deleted

## 2017-12-22 VITALS — BP 102/71 | HR 73 | Temp 98.1°F | Wt 168.2 lb

## 2017-12-22 DIAGNOSIS — Z006 Encounter for examination for normal comparison and control in clinical research program: Secondary | ICD-10-CM

## 2017-12-22 NOTE — Progress Notes (Signed)
Adam Murphy is here for his week 59 visit. He is somewhat down because 2 of his friends died last week and his husband is moving away for a job opportunity. [facility] so we are requesting records. He said he had some mild pain and swelling from the last injection, which only lasted a day. He will be returning in Sept. For the next study visit.

## 2017-12-23 LAB — CBC WITH DIFFERENTIAL/PLATELET
BASOS ABS: 20 {cells}/uL (ref 0–200)
Basophils Relative: 0.4 %
EOS ABS: 128 {cells}/uL (ref 15–500)
EOS PCT: 2.5 %
HEMATOCRIT: 41.7 % (ref 38.5–50.0)
HEMOGLOBIN: 14.2 g/dL (ref 13.2–17.1)
Lymphs Abs: 2351 cells/uL (ref 850–3900)
MCH: 29.2 pg (ref 27.0–33.0)
MCHC: 34.1 g/dL (ref 32.0–36.0)
MCV: 85.6 fL (ref 80.0–100.0)
MPV: 10.4 fL (ref 7.5–12.5)
Monocytes Relative: 8.8 %
NEUTROS ABS: 2152 {cells}/uL (ref 1500–7800)
Neutrophils Relative %: 42.2 %
Platelets: 281 10*3/uL (ref 140–400)
RBC: 4.87 10*6/uL (ref 4.20–5.80)
RDW: 12.1 % (ref 11.0–15.0)
Total Lymphocyte: 46.1 %
WBC mixed population: 449 cells/uL (ref 200–950)
WBC: 5.1 10*3/uL (ref 3.8–10.8)

## 2017-12-23 LAB — LIPID PANEL
CHOL/HDL RATIO: 3.9 (calc) (ref <5.0)
Cholesterol: 155 mg/dL (ref <200)
HDL: 40 mg/dL — AB (ref >40)
LDL CHOLESTEROL (CALC): 96 mg/dL
NON-HDL CHOLESTEROL (CALC): 115 mg/dL (ref <130)
TRIGLYCERIDES: 94 mg/dL (ref <150)

## 2017-12-23 LAB — COMPREHENSIVE METABOLIC PANEL
AG Ratio: 1.5 (calc) (ref 1.0–2.5)
ALBUMIN MSPROF: 4.3 g/dL (ref 3.6–5.1)
ALT: 36 U/L (ref 9–46)
AST: 21 U/L (ref 10–40)
Alkaline phosphatase (APISO): 56 U/L (ref 40–115)
BILIRUBIN TOTAL: 0.5 mg/dL (ref 0.2–1.2)
BUN: 13 mg/dL (ref 7–25)
CALCIUM: 9.3 mg/dL (ref 8.6–10.3)
CO2: 27 mmol/L (ref 20–32)
Chloride: 104 mmol/L (ref 98–110)
Creat: 1.18 mg/dL (ref 0.60–1.35)
Globulin: 2.8 g/dL (calc) (ref 1.9–3.7)
Glucose, Bld: 89 mg/dL (ref 65–99)
POTASSIUM: 4.2 mmol/L (ref 3.5–5.3)
SODIUM: 137 mmol/L (ref 135–146)
TOTAL PROTEIN: 7.1 g/dL (ref 6.1–8.1)

## 2017-12-23 LAB — LIPASE: Lipase: 13 U/L (ref 7–60)

## 2017-12-23 LAB — PHOSPHORUS: Phosphorus: 4 mg/dL (ref 2.5–4.5)

## 2017-12-23 LAB — HIV ANTIBODY (ROUTINE TESTING W REFLEX): HIV 1&2 Ab, 4th Generation: NONREACTIVE

## 2017-12-23 LAB — CK: Total CK: 131 U/L (ref 44–196)

## 2017-12-23 LAB — AMYLASE: Amylase: 40 U/L (ref 21–101)

## 2018-02-10 ENCOUNTER — Encounter (INDEPENDENT_AMBULATORY_CARE_PROVIDER_SITE_OTHER): Payer: Self-pay

## 2018-02-10 VITALS — BP 99/66 | HR 82 | Temp 98.1°F | Wt 157.5 lb

## 2018-02-10 DIAGNOSIS — Z006 Encounter for examination for normal comparison and control in clinical research program: Secondary | ICD-10-CM

## 2018-02-10 NOTE — Progress Notes (Signed)
Participant here for [id number] study. No new concerns voiced. He is doing well, just returned home Tuesday from visiting his husband out of state. Today's rapid HIV non-reactive. Received new dispense of oral IP and injection of cabotegravir/placebo in left glute. No issues noted. He is scheduled for follow up on 9/12.

## 2018-02-11 LAB — COMPREHENSIVE METABOLIC PANEL
AG RATIO: 1.7 (calc) (ref 1.0–2.5)
ALKALINE PHOSPHATASE (APISO): 57 U/L (ref 40–115)
ALT: 17 U/L (ref 9–46)
AST: 17 U/L (ref 10–40)
Albumin: 4.5 g/dL (ref 3.6–5.1)
BILIRUBIN TOTAL: 0.7 mg/dL (ref 0.2–1.2)
BUN: 7 mg/dL (ref 7–25)
CALCIUM: 9.3 mg/dL (ref 8.6–10.3)
CO2: 29 mmol/L (ref 20–32)
Chloride: 103 mmol/L (ref 98–110)
Creat: 1.18 mg/dL (ref 0.60–1.35)
Globulin: 2.7 g/dL (calc) (ref 1.9–3.7)
Glucose, Bld: 90 mg/dL (ref 65–99)
Potassium: 4.4 mmol/L (ref 3.5–5.3)
Sodium: 140 mmol/L (ref 135–146)
Total Protein: 7.2 g/dL (ref 6.1–8.1)

## 2018-02-11 LAB — CBC WITH DIFFERENTIAL/PLATELET
BASOS ABS: 18 {cells}/uL (ref 0–200)
Basophils Relative: 0.4 %
Eosinophils Absolute: 87 cells/uL (ref 15–500)
Eosinophils Relative: 1.9 %
HEMATOCRIT: 42.3 % (ref 38.5–50.0)
Hemoglobin: 14.3 g/dL (ref 13.2–17.1)
LYMPHS ABS: 1431 {cells}/uL (ref 850–3900)
MCH: 28.8 pg (ref 27.0–33.0)
MCHC: 33.8 g/dL (ref 32.0–36.0)
MCV: 85.1 fL (ref 80.0–100.0)
MPV: 10.8 fL (ref 7.5–12.5)
Monocytes Relative: 9.5 %
NEUTROS PCT: 57.1 %
Neutro Abs: 2627 cells/uL (ref 1500–7800)
Platelets: 276 10*3/uL (ref 140–400)
RBC: 4.97 10*6/uL (ref 4.20–5.80)
RDW: 12.4 % (ref 11.0–15.0)
Total Lymphocyte: 31.1 %
WBC: 4.6 10*3/uL (ref 3.8–10.8)
WBCMIX: 437 {cells}/uL (ref 200–950)

## 2018-02-11 LAB — AMYLASE: AMYLASE: 43 U/L (ref 21–101)

## 2018-02-11 LAB — HIV ANTIBODY (ROUTINE TESTING W REFLEX): HIV: NONREACTIVE

## 2018-02-11 LAB — LIPASE: Lipase: 13 U/L (ref 7–60)

## 2018-02-11 LAB — PHOSPHORUS: Phosphorus: 3.2 mg/dL (ref 2.5–4.5)

## 2018-02-11 LAB — CK: CK TOTAL: 123 U/L (ref 44–196)

## 2018-02-17 ENCOUNTER — Encounter (INDEPENDENT_AMBULATORY_CARE_PROVIDER_SITE_OTHER): Payer: Managed Care, Other (non HMO) | Admitting: *Deleted

## 2018-02-17 VITALS — BP 121/76 | HR 80 | Temp 97.8°F | Wt 158.8 lb

## 2018-02-17 DIAGNOSIS — Z006 Encounter for examination for normal comparison and control in clinical research program: Secondary | ICD-10-CM

## 2018-02-17 NOTE — Progress Notes (Signed)
Adam Murphy was here for his week 67 visit [facility]. He said he was recently exposed to GC/Chlamydia , so he went and got tested and treated. He has not gotten the results yet. He said he has been very adherent with his meds. He had about 4 days of soreness at the injection site this time. He will be returning Oct 22 for the next injection visit

## 2018-02-18 LAB — COMPREHENSIVE METABOLIC PANEL
AG Ratio: 1.5 (calc) (ref 1.0–2.5)
ALT: 14 U/L (ref 9–46)
AST: 16 U/L (ref 10–40)
Albumin: 4.4 g/dL (ref 3.6–5.1)
Alkaline phosphatase (APISO): 45 U/L (ref 40–115)
BUN: 10 mg/dL (ref 7–25)
CO2: 27 mmol/L (ref 20–32)
Calcium: 9.5 mg/dL (ref 8.6–10.3)
Chloride: 104 mmol/L (ref 98–110)
Creat: 1.21 mg/dL (ref 0.60–1.35)
GLOBULIN: 3 g/dL (ref 1.9–3.7)
Glucose, Bld: 90 mg/dL (ref 65–99)
Potassium: 4.3 mmol/L (ref 3.5–5.3)
Sodium: 139 mmol/L (ref 135–146)
Total Bilirubin: 0.5 mg/dL (ref 0.2–1.2)
Total Protein: 7.4 g/dL (ref 6.1–8.1)

## 2018-02-18 LAB — CBC WITH DIFFERENTIAL/PLATELET
BASOS PCT: 0.5 %
Basophils Absolute: 21 cells/uL (ref 0–200)
EOS PCT: 2 %
Eosinophils Absolute: 82 cells/uL (ref 15–500)
HEMATOCRIT: 41.9 % (ref 38.5–50.0)
HEMOGLOBIN: 14.2 g/dL (ref 13.2–17.1)
LYMPHS ABS: 2271 {cells}/uL (ref 850–3900)
MCH: 28.9 pg (ref 27.0–33.0)
MCHC: 33.9 g/dL (ref 32.0–36.0)
MCV: 85.3 fL (ref 80.0–100.0)
MPV: 10.4 fL (ref 7.5–12.5)
Monocytes Relative: 7.1 %
NEUTROS ABS: 1435 {cells}/uL — AB (ref 1500–7800)
Neutrophils Relative %: 35 %
Platelets: 310 10*3/uL (ref 140–400)
RBC: 4.91 10*6/uL (ref 4.20–5.80)
RDW: 12.3 % (ref 11.0–15.0)
Total Lymphocyte: 55.4 %
WBC mixed population: 291 cells/uL (ref 200–950)
WBC: 4.1 10*3/uL (ref 3.8–10.8)

## 2018-02-18 LAB — HIV ANTIBODY (ROUTINE TESTING W REFLEX): HIV: NONREACTIVE

## 2018-02-18 LAB — PHOSPHORUS: Phosphorus: 3.7 mg/dL (ref 2.5–4.5)

## 2018-02-18 LAB — AMYLASE: Amylase: 40 U/L (ref 21–101)

## 2018-02-18 LAB — LIPASE: Lipase: 9 U/L (ref 7–60)

## 2018-02-18 LAB — CK: Total CK: 255 U/L — ABNORMAL HIGH (ref 44–196)

## 2018-03-29 VITALS — BP 122/86 | HR 76 | Temp 98.3°F | Wt 157.5 lb

## 2018-03-29 DIAGNOSIS — Z006 Encounter for examination for normal comparison and control in clinical research program: Secondary | ICD-10-CM

## 2018-03-29 NOTE — Research (Signed)
Participant here for [id number] study visit for injection. Today's rapid HIV non-reactive. No new medical concerns voiced and no changes in medications have occurred since last visit. Received new dispense of oral IP and injection of cabotegravir/placebo in right glute. Participant is scheduled for a return visit on 11/12.

## 2018-03-30 LAB — COMPREHENSIVE METABOLIC PANEL WITH GFR
AG Ratio: 1.6 (calc) (ref 1.0–2.5)
ALT: 15 U/L (ref 9–46)
AST: 14 U/L (ref 10–40)
Albumin: 4.5 g/dL (ref 3.6–5.1)
Alkaline phosphatase (APISO): 61 U/L (ref 40–115)
BUN: 8 mg/dL (ref 7–25)
CO2: 27 mmol/L (ref 20–32)
Calcium: 9.5 mg/dL (ref 8.6–10.3)
Chloride: 103 mmol/L (ref 98–110)
Creat: 1.06 mg/dL (ref 0.60–1.35)
Globulin: 2.8 g/dL (ref 1.9–3.7)
Glucose, Bld: 77 mg/dL (ref 65–99)
Potassium: 4.5 mmol/L (ref 3.5–5.3)
Sodium: 139 mmol/L (ref 135–146)
Total Bilirubin: 0.6 mg/dL (ref 0.2–1.2)
Total Protein: 7.3 g/dL (ref 6.1–8.1)

## 2018-03-30 LAB — LIPASE: Lipase: 13 U/L (ref 7–60)

## 2018-03-30 LAB — CBC WITH DIFFERENTIAL/PLATELET
BASOS PCT: 0.4 %
Basophils Absolute: 20 cells/uL (ref 0–200)
EOS ABS: 142 {cells}/uL (ref 15–500)
Eosinophils Relative: 2.9 %
HCT: 42 % (ref 38.5–50.0)
Hemoglobin: 14.5 g/dL (ref 13.2–17.1)
Lymphs Abs: 2024 cells/uL (ref 850–3900)
MCH: 29.6 pg (ref 27.0–33.0)
MCHC: 34.5 g/dL (ref 32.0–36.0)
MCV: 85.7 fL (ref 80.0–100.0)
MONOS PCT: 7.6 %
MPV: 10.9 fL (ref 7.5–12.5)
Neutro Abs: 2342 cells/uL (ref 1500–7800)
Neutrophils Relative %: 47.8 %
PLATELETS: 304 10*3/uL (ref 140–400)
RBC: 4.9 10*6/uL (ref 4.20–5.80)
RDW: 12.3 % (ref 11.0–15.0)
TOTAL LYMPHOCYTE: 41.3 %
WBC mixed population: 372 cells/uL (ref 200–950)
WBC: 4.9 10*3/uL (ref 3.8–10.8)

## 2018-03-30 LAB — AMYLASE: Amylase: 41 U/L (ref 21–101)

## 2018-03-30 LAB — HIV ANTIBODY (ROUTINE TESTING W REFLEX): HIV: NONREACTIVE

## 2018-03-30 LAB — CK: Total CK: 219 U/L — ABNORMAL HIGH (ref 44–196)

## 2018-03-30 LAB — PHOSPHORUS: Phosphorus: 3.3 mg/dL (ref 2.5–4.5)

## 2018-04-19 ENCOUNTER — Encounter (INDEPENDENT_AMBULATORY_CARE_PROVIDER_SITE_OTHER): Payer: Managed Care, Other (non HMO) | Admitting: *Deleted

## 2018-04-19 ENCOUNTER — Other Ambulatory Visit: Payer: Self-pay | Admitting: *Deleted

## 2018-04-19 VITALS — BP 102/68 | HR 87 | Temp 97.8°F | Wt 153.5 lb

## 2018-04-19 DIAGNOSIS — Z006 Encounter for examination for normal comparison and control in clinical research program: Secondary | ICD-10-CM

## 2018-04-19 LAB — CBC WITH DIFFERENTIAL/PLATELET
BASOS ABS: 19 {cells}/uL (ref 0–200)
BASOS PCT: 0.4 %
EOS ABS: 108 {cells}/uL (ref 15–500)
Eosinophils Relative: 2.3 %
HCT: 42 % (ref 38.5–50.0)
HEMOGLOBIN: 14.5 g/dL (ref 13.2–17.1)
Lymphs Abs: 1767 cells/uL (ref 850–3900)
MCH: 29.5 pg (ref 27.0–33.0)
MCHC: 34.5 g/dL (ref 32.0–36.0)
MCV: 85.5 fL (ref 80.0–100.0)
MPV: 10.6 fL (ref 7.5–12.5)
Monocytes Relative: 7 %
Neutro Abs: 2477 cells/uL (ref 1500–7800)
Neutrophils Relative %: 52.7 %
PLATELETS: 356 10*3/uL (ref 140–400)
RBC: 4.91 10*6/uL (ref 4.20–5.80)
RDW: 12.5 % (ref 11.0–15.0)
Total Lymphocyte: 37.6 %
WBC: 4.7 10*3/uL (ref 3.8–10.8)
WBCMIX: 329 {cells}/uL (ref 200–950)

## 2018-04-19 MED ORDER — STUDY - HPTN 083 - EMTRICITABINE/TENOFOVIR DISOPROXIL FUMARATE 200-300MG (TRUVADA) OR PLACEBO TABLET (PI-VAN DAM): 1.0000 | ORAL_TABLET | Freq: Every day | ORAL | Status: DC

## 2018-04-19 MED ORDER — STUDY - HPTN 083 - CABOTEGRAVIR 600MG/3ML INJECTION OR PLACEBO (PI-VAN DAM): 600.0000 mg | INJECTION | INTRAMUSCULAR | 0 refills | Status: DC

## 2018-04-19 NOTE — Addendum Note (Signed)
Addended by: Phill MyronEPPERSON, KIMBERLY C on: 04/19/2018 08:59 AM   Modules accepted: Orders

## 2018-04-19 NOTE — Research (Signed)
Adam Murphy is here for his week 75 visit [name]. He says he was recently tested and treated at [facility] for syphilis exposure, tests were negative. He denies any other problems or adherence issues. He denies any injection site reactions from the last visit and will be returning in December.

## 2018-04-20 LAB — COMPLETE METABOLIC PANEL WITH GFR
AG Ratio: 1.7 (calc) (ref 1.0–2.5)
ALT: 35 U/L (ref 9–46)
AST: 26 U/L (ref 10–40)
Albumin: 4.4 g/dL (ref 3.6–5.1)
Alkaline phosphatase (APISO): 58 U/L (ref 40–115)
BILIRUBIN TOTAL: 0.4 mg/dL (ref 0.2–1.2)
BUN: 9 mg/dL (ref 7–25)
CHLORIDE: 106 mmol/L (ref 98–110)
CO2: 27 mmol/L (ref 20–32)
CREATININE: 1 mg/dL (ref 0.60–1.35)
Calcium: 9.5 mg/dL (ref 8.6–10.3)
GFR, Est African American: 122 mL/min/{1.73_m2} (ref >=60)
GFR, Est Non African American: 105 mL/min/{1.73_m2} (ref >=60)
GLOBULIN: 2.6 g/dL (ref 1.9–3.7)
Glucose, Bld: 85 mg/dL (ref 65–99)
POTASSIUM: 4.2 mmol/L (ref 3.5–5.3)
SODIUM: 141 mmol/L (ref 135–146)
Total Protein: 7 g/dL (ref 6.1–8.1)

## 2018-04-20 LAB — CK: Total CK: 122 U/L (ref 44–196)

## 2018-04-20 LAB — HIV ANTIBODY (ROUTINE TESTING W REFLEX): HIV 1&2 Ab, 4th Generation: NONREACTIVE

## 2018-04-20 LAB — PHOSPHORUS: PHOSPHORUS: 3.1 mg/dL (ref 2.5–4.5)

## 2018-04-20 LAB — LIPASE: Lipase: 34 U/L (ref 7–60)

## 2018-04-20 LAB — AMYLASE: Amylase: 68 U/L (ref 21–101)

## 2018-05-24 ENCOUNTER — Encounter (INDEPENDENT_AMBULATORY_CARE_PROVIDER_SITE_OTHER): Payer: Managed Care, Other (non HMO) | Admitting: *Deleted

## 2018-05-24 VITALS — BP 96/68 | HR 76 | Temp 98.0°F | Wt 152.8 lb

## 2018-05-24 DIAGNOSIS — Z006 Encounter for examination for normal comparison and control in clinical research program: Secondary | ICD-10-CM

## 2018-05-24 NOTE — Research (Signed)
Adam Murphy is [facility]. He received an injection of study prod. In his left buttock without problem. He said he had developed a whitepenile discharge on the 6th od Dec. And went and got tested and treated at [facility] for GC/Chlamydia, which was negative. He said it stopped the day after the treatment. He will be returning 1/3 for the safety visit.

## 2018-05-25 LAB — CBC WITH DIFFERENTIAL/PLATELET
ABSOLUTE MONOCYTES: 302 {cells}/uL (ref 200–950)
BASOS PCT: 0.2 %
Basophils Absolute: 8 cells/uL (ref 0–200)
Eosinophils Absolute: 109 cells/uL (ref 15–500)
Eosinophils Relative: 2.6 %
HCT: 43.8 % (ref 38.5–50.0)
Hemoglobin: 15 g/dL (ref 13.2–17.1)
Lymphs Abs: 1966 cells/uL (ref 850–3900)
MCH: 29.8 pg (ref 27.0–33.0)
MCHC: 34.2 g/dL (ref 32.0–36.0)
MCV: 87.1 fL (ref 80.0–100.0)
MONOS PCT: 7.2 %
MPV: 10.6 fL (ref 7.5–12.5)
Neutro Abs: 1814 cells/uL (ref 1500–7800)
Neutrophils Relative %: 43.2 %
PLATELETS: 340 10*3/uL (ref 140–400)
RBC: 5.03 10*6/uL (ref 4.20–5.80)
RDW: 12.5 % (ref 11.0–15.0)
TOTAL LYMPHOCYTE: 46.8 %
WBC: 4.2 10*3/uL (ref 3.8–10.8)

## 2018-05-25 LAB — C. TRACHOMATIS/N. GONORRHOEAE RNA
C. trachomatis RNA, TMA: NOT DETECTED
N. gonorrhoeae RNA, TMA: NOT DETECTED

## 2018-05-25 LAB — COMPLETE METABOLIC PANEL WITH GFR
AG Ratio: 1.6 (calc) (ref 1.0–2.5)
ALKALINE PHOSPHATASE (APISO): 74 U/L (ref 40–115)
ALT: 31 U/L (ref 9–46)
AST: 23 U/L (ref 10–40)
Albumin: 4.7 g/dL (ref 3.6–5.1)
BILIRUBIN TOTAL: 0.4 mg/dL (ref 0.2–1.2)
BUN: 9 mg/dL (ref 7–25)
CHLORIDE: 103 mmol/L (ref 98–110)
CO2: 29 mmol/L (ref 20–32)
CREATININE: 1.14 mg/dL (ref 0.60–1.35)
Calcium: 9.8 mg/dL (ref 8.6–10.3)
GFR, Est African American: 104 mL/min/{1.73_m2} (ref >=60)
GFR, Est Non African American: 90 mL/min/{1.73_m2} (ref >=60)
GLOBULIN: 3 g/dL (ref 1.9–3.7)
GLUCOSE: 83 mg/dL (ref 65–99)
POTASSIUM: 4.5 mmol/L (ref 3.5–5.3)
SODIUM: 138 mmol/L (ref 135–146)
Total Protein: 7.7 g/dL (ref 6.1–8.1)

## 2018-05-25 LAB — LIPASE: Lipase: 20 U/L (ref 7–60)

## 2018-05-25 LAB — HIV ANTIBODY (ROUTINE TESTING W REFLEX): HIV 1&2 Ab, 4th Generation: NONREACTIVE

## 2018-05-25 LAB — AMYLASE: AMYLASE: 50 U/L (ref 21–101)

## 2018-05-25 LAB — PHOSPHORUS: PHOSPHORUS: 3.7 mg/dL (ref 2.5–4.5)

## 2018-05-25 LAB — CK: Total CK: 162 U/L (ref 44–196)

## 2018-05-25 LAB — RPR: RPR Ser Ql: NONREACTIVE

## 2018-05-26 LAB — CT/NG RNA, TMA RECTAL
Chlamydia Trachomatis RNA: NOT DETECTED
NEISSERIA GONORRHOEAE RNA: NOT DETECTED

## 2018-06-10 ENCOUNTER — Encounter (INDEPENDENT_AMBULATORY_CARE_PROVIDER_SITE_OTHER): Payer: Managed Care, Other (non HMO) | Admitting: *Deleted

## 2018-06-10 VITALS — BP 126/80 | HR 93 | Temp 98.7°F | Wt 152.2 lb

## 2018-06-10 DIAGNOSIS — Z006 Encounter for examination for normal comparison and control in clinical research program: Secondary | ICD-10-CM

## 2018-06-10 NOTE — Research (Signed)
Adam Murphy was here today for a safety visit for the HPTN 083 study. He says he has been sick since last Saturday with a bad sore throat, headache fevers, sweats, fatigue and went to an Urgent care. They tested him for strep which was negative but gave him cefdinir for treatment. On exam his tonsils are red, slightly swollen and some white ulcerated areas are noted. He still feels bad and is planning on going back to an UC this evening for followup. His temp. Was 98.7 this morning, but he is still very sweaty. He has been taking tylenol and nyquil for symptoms. He denies any significant ISR or problems taking his meds and will be returning on Feb. 11th.

## 2018-06-11 LAB — CBC WITH DIFFERENTIAL/PLATELET
Absolute Monocytes: 440 cells/uL (ref 200–950)
Basophils Absolute: 32 cells/uL (ref 0–200)
Basophils Relative: 0.6 %
Eosinophils Absolute: 69 cells/uL (ref 15–500)
Eosinophils Relative: 1.3 %
HCT: 43.6 % (ref 38.5–50.0)
Hemoglobin: 14.9 g/dL (ref 13.2–17.1)
Lymphs Abs: 2247 cells/uL (ref 850–3900)
MCH: 29.2 pg (ref 27.0–33.0)
MCHC: 34.2 g/dL (ref 32.0–36.0)
MCV: 85.3 fL (ref 80.0–100.0)
MPV: 10.6 fL (ref 7.5–12.5)
Monocytes Relative: 8.3 %
Neutro Abs: 2512 cells/uL (ref 1500–7800)
Neutrophils Relative %: 47.4 %
PLATELETS: 355 10*3/uL (ref 140–400)
RBC: 5.11 10*6/uL (ref 4.20–5.80)
RDW: 12.3 % (ref 11.0–15.0)
TOTAL LYMPHOCYTE: 42.4 %
WBC: 5.3 10*3/uL (ref 3.8–10.8)

## 2018-06-11 LAB — COMPLETE METABOLIC PANEL WITH GFR
AG RATIO: 1.4 (calc) (ref 1.0–2.5)
ALT: 21 U/L (ref 9–46)
AST: 19 U/L (ref 10–40)
Albumin: 4.5 g/dL (ref 3.6–5.1)
Alkaline phosphatase (APISO): 66 U/L (ref 40–115)
BUN: 7 mg/dL (ref 7–25)
CO2: 29 mmol/L (ref 20–32)
Calcium: 9.5 mg/dL (ref 8.6–10.3)
Chloride: 101 mmol/L (ref 98–110)
Creat: 1.1 mg/dL (ref 0.60–1.35)
GFR, Est African American: 108 mL/min/{1.73_m2} (ref >=60)
GFR, Est Non African American: 93 mL/min/{1.73_m2} (ref >=60)
Globulin: 3.3 g/dL (calc) (ref 1.9–3.7)
Glucose, Bld: 84 mg/dL (ref 65–99)
POTASSIUM: 4.4 mmol/L (ref 3.5–5.3)
Sodium: 139 mmol/L (ref 135–146)
Total Bilirubin: 0.4 mg/dL (ref 0.2–1.2)
Total Protein: 7.8 g/dL (ref 6.1–8.1)

## 2018-06-11 LAB — AMYLASE: Amylase: 53 U/L (ref 21–101)

## 2018-06-11 LAB — PHOSPHORUS: Phosphorus: 3.4 mg/dL (ref 2.5–4.5)

## 2018-06-11 LAB — CK: Total CK: 109 U/L (ref 44–196)

## 2018-06-11 LAB — HIV ANTIBODY (ROUTINE TESTING W REFLEX): HIV 1&2 Ab, 4th Generation: NONREACTIVE

## 2018-06-11 LAB — LIPASE: Lipase: 25 U/L (ref 7–60)

## 2018-07-19 ENCOUNTER — Encounter (INDEPENDENT_AMBULATORY_CARE_PROVIDER_SITE_OTHER): Payer: Self-pay

## 2018-07-19 VITALS — BP 114/75 | HR 83 | Temp 98.0°F | Wt 155.0 lb

## 2018-07-19 DIAGNOSIS — Z006 Encounter for examination for normal comparison and control in clinical research program: Secondary | ICD-10-CM

## 2018-07-19 NOTE — Research (Signed)
Participant here for [id number] study. No new complaints or concerns voiced. He is doing well with taking his study medication, per pill count he has taken them daily. Today's rapid HIV non-reactive. He received new dispense of study IP and IM injection of cabotegravir/placebo in the right glute. He is scheduled for follow up on 2/25.

## 2018-07-20 LAB — CBC WITH DIFFERENTIAL/PLATELET
Absolute Monocytes: 285 cells/uL (ref 200–950)
Basophils Absolute: 11 cells/uL (ref 0–200)
Basophils Relative: 0.3 %
EOS ABS: 110 {cells}/uL (ref 15–500)
Eosinophils Relative: 2.9 %
HCT: 41.6 % (ref 38.5–50.0)
Hemoglobin: 14.3 g/dL (ref 13.2–17.1)
Lymphs Abs: 1763 cells/uL (ref 850–3900)
MCH: 29.3 pg (ref 27.0–33.0)
MCHC: 34.4 g/dL (ref 32.0–36.0)
MCV: 85.2 fL (ref 80.0–100.0)
MPV: 10.5 fL (ref 7.5–12.5)
Monocytes Relative: 7.5 %
Neutro Abs: 1630 cells/uL (ref 1500–7800)
Neutrophils Relative %: 42.9 %
Platelets: 285 10*3/uL (ref 140–400)
RBC: 4.88 10*6/uL (ref 4.20–5.80)
RDW: 12.6 % (ref 11.0–15.0)
Total Lymphocyte: 46.4 %
WBC: 3.8 10*3/uL (ref 3.8–10.8)

## 2018-07-20 LAB — PHOSPHORUS: Phosphorus: 3.5 mg/dL (ref 2.5–4.5)

## 2018-07-20 LAB — COMPREHENSIVE METABOLIC PANEL
AG Ratio: 1.6 (calc) (ref 1.0–2.5)
ALBUMIN MSPROF: 4.3 g/dL (ref 3.6–5.1)
ALKALINE PHOSPHATASE (APISO): 50 U/L (ref 36–130)
ALT: 18 U/L (ref 9–46)
AST: 18 U/L (ref 10–40)
BUN: 10 mg/dL (ref 7–25)
CO2: 29 mmol/L (ref 20–32)
Calcium: 9.2 mg/dL (ref 8.6–10.3)
Chloride: 102 mmol/L (ref 98–110)
Creat: 1.03 mg/dL (ref 0.60–1.35)
Globulin: 2.7 g/dL (calc) (ref 1.9–3.7)
Glucose, Bld: 78 mg/dL (ref 65–99)
Potassium: 4.6 mmol/L (ref 3.5–5.3)
Sodium: 138 mmol/L (ref 135–146)
Total Bilirubin: 0.9 mg/dL (ref 0.2–1.2)
Total Protein: 7 g/dL (ref 6.1–8.1)

## 2018-07-20 LAB — AMYLASE: Amylase: 42 U/L (ref 21–101)

## 2018-07-20 LAB — CK: Total CK: 251 U/L — ABNORMAL HIGH (ref 44–196)

## 2018-07-20 LAB — LIPASE: Lipase: 16 U/L (ref 7–60)

## 2018-07-20 LAB — HIV ANTIBODY (ROUTINE TESTING W REFLEX): HIV 1&2 Ab, 4th Generation: NONREACTIVE

## 2018-08-02 ENCOUNTER — Encounter (INDEPENDENT_AMBULATORY_CARE_PROVIDER_SITE_OTHER): Payer: Managed Care, Other (non HMO) | Admitting: *Deleted

## 2018-08-02 VITALS — BP 104/70 | HR 80 | Temp 98.2°F | Wt 154.2 lb

## 2018-08-02 DIAGNOSIS — Z006 Encounter for examination for normal comparison and control in clinical research program: Secondary | ICD-10-CM

## 2018-08-02 NOTE — Research (Signed)
Adam Murphy was here for his week 91 safety visit for [facility] 083. He denies any new problems or injection site reactions from the last injection. He did say he went to an urgent care and got tested for STds around the first of February which was negative. He was given rocephin and the azithromycin for treatment due to clear penile discharge.  He will be returning in April for the next visit.

## 2018-08-03 LAB — COMPLETE METABOLIC PANEL WITH GFR
AG Ratio: 1.8 (calc) (ref 1.0–2.5)
ALBUMIN MSPROF: 4.5 g/dL (ref 3.6–5.1)
ALT: 17 U/L (ref 9–46)
AST: 17 U/L (ref 10–40)
Alkaline phosphatase (APISO): 55 U/L (ref 36–130)
BUN: 9 mg/dL (ref 7–25)
CO2: 26 mmol/L (ref 20–32)
Calcium: 9.5 mg/dL (ref 8.6–10.3)
Chloride: 106 mmol/L (ref 98–110)
Creat: 1.11 mg/dL (ref 0.60–1.35)
GFR, Est African American: 107 mL/min/{1.73_m2} (ref >=60)
GFR, Est Non African American: 92 mL/min/{1.73_m2} (ref >=60)
Globulin: 2.5 g/dL (calc) (ref 1.9–3.7)
Glucose, Bld: 88 mg/dL (ref 65–99)
POTASSIUM: 4.6 mmol/L (ref 3.5–5.3)
Sodium: 139 mmol/L (ref 135–146)
Total Bilirubin: 0.4 mg/dL (ref 0.2–1.2)
Total Protein: 7 g/dL (ref 6.1–8.1)

## 2018-08-03 LAB — CBC WITH DIFFERENTIAL/PLATELET
Absolute Monocytes: 270 cells/uL (ref 200–950)
Basophils Absolute: 11 cells/uL (ref 0–200)
Basophils Relative: 0.3 %
Eosinophils Absolute: 109 cells/uL (ref 15–500)
Eosinophils Relative: 3.1 %
HCT: 41 % (ref 38.5–50.0)
Hemoglobin: 14 g/dL (ref 13.2–17.1)
Lymphs Abs: 1561 cells/uL (ref 850–3900)
MCH: 29.2 pg (ref 27.0–33.0)
MCHC: 34.1 g/dL (ref 32.0–36.0)
MCV: 85.4 fL (ref 80.0–100.0)
MPV: 10.2 fL (ref 7.5–12.5)
Monocytes Relative: 7.7 %
Neutro Abs: 1551 cells/uL (ref 1500–7800)
Neutrophils Relative %: 44.3 %
Platelets: 322 10*3/uL (ref 140–400)
RBC: 4.8 10*6/uL (ref 4.20–5.80)
RDW: 12.8 % (ref 11.0–15.0)
Total Lymphocyte: 44.6 %
WBC: 3.5 10*3/uL — ABNORMAL LOW (ref 3.8–10.8)

## 2018-08-03 LAB — AMYLASE: Amylase: 41 U/L (ref 21–101)

## 2018-08-03 LAB — PHOSPHORUS: PHOSPHORUS: 4.2 mg/dL (ref 2.5–4.5)

## 2018-08-03 LAB — CK: Total CK: 315 U/L — ABNORMAL HIGH (ref 44–196)

## 2018-08-03 LAB — HIV ANTIBODY (ROUTINE TESTING W REFLEX): HIV 1&2 Ab, 4th Generation: NONREACTIVE

## 2018-08-03 LAB — LIPASE: LIPASE: 14 U/L (ref 7–60)

## 2018-08-30 ENCOUNTER — Other Ambulatory Visit: Payer: Self-pay

## 2018-08-30 ENCOUNTER — Encounter (INDEPENDENT_AMBULATORY_CARE_PROVIDER_SITE_OTHER): Payer: Self-pay

## 2018-08-30 VITALS — BP 100/70 | HR 71 | Temp 98.1°F | Wt 158.5 lb

## 2018-08-30 DIAGNOSIS — Z006 Encounter for examination for normal comparison and control in clinical research program: Secondary | ICD-10-CM

## 2018-08-30 NOTE — Research (Signed)
Participant here for [id number] study visit for week 97. He is doing well with daily medication compliance. He has no new issues or concerns. Today's rapid HIV non-reactive. He received new dispense of oral IP and IM injection of cabotegravir/placebo. He will follow up for safety visit on 09/13/18.

## 2018-08-31 LAB — CBC WITH DIFFERENTIAL/PLATELET
Absolute Monocytes: 315 cells/uL (ref 200–950)
Basophils Absolute: 8 cells/uL (ref 0–200)
Basophils Relative: 0.2 %
Eosinophils Absolute: 151 cells/uL (ref 15–500)
Eosinophils Relative: 3.6 %
HEMATOCRIT: 42.8 % (ref 38.5–50.0)
Hemoglobin: 14.6 g/dL (ref 13.2–17.1)
Lymphs Abs: 1823 cells/uL (ref 850–3900)
MCH: 29.3 pg (ref 27.0–33.0)
MCHC: 34.1 g/dL (ref 32.0–36.0)
MCV: 85.9 fL (ref 80.0–100.0)
MPV: 10.7 fL (ref 7.5–12.5)
Monocytes Relative: 7.5 %
Neutro Abs: 1903 cells/uL (ref 1500–7800)
Neutrophils Relative %: 45.3 %
Platelets: 294 10*3/uL (ref 140–400)
RBC: 4.98 10*6/uL (ref 4.20–5.80)
RDW: 13 % (ref 11.0–15.0)
Total Lymphocyte: 43.4 %
WBC: 4.2 10*3/uL (ref 3.8–10.8)

## 2018-08-31 LAB — HIV ANTIBODY (ROUTINE TESTING W REFLEX): HIV 1&2 Ab, 4th Generation: NONREACTIVE

## 2018-08-31 LAB — COMPREHENSIVE METABOLIC PANEL
AG Ratio: 1.8 (calc) (ref 1.0–2.5)
ALT: 17 U/L (ref 9–46)
AST: 16 U/L (ref 10–40)
Albumin: 4.8 g/dL (ref 3.6–5.1)
Alkaline phosphatase (APISO): 61 U/L (ref 36–130)
BUN: 7 mg/dL (ref 7–25)
CO2: 30 mmol/L (ref 20–32)
Calcium: 10.1 mg/dL (ref 8.6–10.3)
Chloride: 102 mmol/L (ref 98–110)
Creat: 1.14 mg/dL (ref 0.60–1.35)
Globulin: 2.6 g/dL (calc) (ref 1.9–3.7)
Glucose, Bld: 83 mg/dL (ref 65–99)
Potassium: 4.6 mmol/L (ref 3.5–5.3)
Sodium: 139 mmol/L (ref 135–146)
Total Bilirubin: 0.5 mg/dL (ref 0.2–1.2)
Total Protein: 7.4 g/dL (ref 6.1–8.1)

## 2018-08-31 LAB — AMYLASE: Amylase: 43 U/L (ref 21–101)

## 2018-08-31 LAB — LIPASE: Lipase: 15 U/L (ref 7–60)

## 2018-08-31 LAB — CK: Total CK: 182 U/L (ref 44–196)

## 2018-08-31 LAB — PHOSPHORUS: PHOSPHORUS: 4.4 mg/dL (ref 2.5–4.5)

## 2018-09-13 ENCOUNTER — Encounter (INDEPENDENT_AMBULATORY_CARE_PROVIDER_SITE_OTHER): Payer: Managed Care, Other (non HMO) | Admitting: *Deleted

## 2018-09-13 ENCOUNTER — Other Ambulatory Visit: Payer: Self-pay

## 2018-09-13 VITALS — BP 106/71 | HR 81 | Temp 98.0°F | Wt 159.2 lb

## 2018-09-13 DIAGNOSIS — Z006 Encounter for examination for normal comparison and control in clinical research program: Secondary | ICD-10-CM

## 2018-09-13 NOTE — Research (Signed)
Adam Murphy is here for his week 99 HPTN 083 visit. Denied any site reaction after his last injection. No new complaints or medications. Rapid HIV non-reactive. He will return in May for his next study visit.

## 2018-09-14 LAB — COMPREHENSIVE METABOLIC PANEL
AG Ratio: 1.6 (calc) (ref 1.0–2.5)
ALT: 15 U/L (ref 9–46)
AST: 14 U/L (ref 10–40)
Albumin: 4.6 g/dL (ref 3.6–5.1)
Alkaline phosphatase (APISO): 53 U/L (ref 36–130)
BUN: 9 mg/dL (ref 7–25)
CO2: 32 mmol/L (ref 20–32)
Calcium: 9.9 mg/dL (ref 8.6–10.3)
Chloride: 101 mmol/L (ref 98–110)
Creat: 1.18 mg/dL (ref 0.60–1.35)
Globulin: 2.8 g/dL (calc) (ref 1.9–3.7)
Glucose, Bld: 66 mg/dL (ref 65–99)
Potassium: 4.7 mmol/L (ref 3.5–5.3)
Sodium: 139 mmol/L (ref 135–146)
Total Bilirubin: 0.7 mg/dL (ref 0.2–1.2)
Total Protein: 7.4 g/dL (ref 6.1–8.1)

## 2018-09-14 LAB — CBC WITH DIFFERENTIAL/PLATELET
Absolute Monocytes: 327 cells/uL (ref 200–950)
Basophils Absolute: 19 cells/uL (ref 0–200)
Basophils Relative: 0.5 %
Eosinophils Absolute: 179 cells/uL (ref 15–500)
Eosinophils Relative: 4.7 %
HCT: 42.8 % (ref 38.5–50.0)
Hemoglobin: 14.4 g/dL (ref 13.2–17.1)
Lymphs Abs: 1630 cells/uL (ref 850–3900)
MCH: 29 pg (ref 27.0–33.0)
MCHC: 33.6 g/dL (ref 32.0–36.0)
MCV: 86.3 fL (ref 80.0–100.0)
MPV: 10.2 fL (ref 7.5–12.5)
Monocytes Relative: 8.6 %
Neutro Abs: 1645 cells/uL (ref 1500–7800)
Neutrophils Relative %: 43.3 %
Platelets: 309 10*3/uL (ref 140–400)
RBC: 4.96 10*6/uL (ref 4.20–5.80)
RDW: 12.8 % (ref 11.0–15.0)
Total Lymphocyte: 42.9 %
WBC: 3.8 10*3/uL (ref 3.8–10.8)

## 2018-09-14 LAB — CK: Total CK: 141 U/L (ref 44–196)

## 2018-09-14 LAB — HIV ANTIBODY (ROUTINE TESTING W REFLEX): HIV 1&2 Ab, 4th Generation: NONREACTIVE

## 2018-09-14 LAB — PHOSPHORUS: Phosphorus: 3.6 mg/dL (ref 2.5–4.5)

## 2018-09-14 LAB — AMYLASE: Amylase: 43 U/L (ref 21–101)

## 2018-09-14 LAB — LIPASE: Lipase: 13 U/L (ref 7–60)

## 2018-11-01 ENCOUNTER — Other Ambulatory Visit: Payer: Self-pay

## 2018-11-01 ENCOUNTER — Encounter (INDEPENDENT_AMBULATORY_CARE_PROVIDER_SITE_OTHER): Payer: Self-pay

## 2018-11-01 VITALS — BP 99/68 | HR 69 | Temp 98.2°F | Wt 162.5 lb

## 2018-11-01 DIAGNOSIS — Z006 Encounter for examination for normal comparison and control in clinical research program: Secondary | ICD-10-CM

## 2018-11-02 LAB — CBC WITH DIFFERENTIAL/PLATELET
Absolute Monocytes: 272 cells/uL (ref 200–950)
Basophils Absolute: 12 cells/uL (ref 0–200)
Basophils Relative: 0.3 %
Eosinophils Absolute: 80 cells/uL (ref 15–500)
Eosinophils Relative: 2 %
HCT: 40.9 % (ref 38.5–50.0)
Hemoglobin: 13.9 g/dL (ref 13.2–17.1)
Lymphs Abs: 1592 cells/uL (ref 850–3900)
MCH: 28.8 pg (ref 27.0–33.0)
MCHC: 34 g/dL (ref 32.0–36.0)
MCV: 84.7 fL (ref 80.0–100.0)
MPV: 10.6 fL (ref 7.5–12.5)
Monocytes Relative: 6.8 %
Neutro Abs: 2044 cells/uL (ref 1500–7800)
Neutrophils Relative %: 51.1 %
Platelets: 286 10*3/uL (ref 140–400)
RBC: 4.83 10*6/uL (ref 4.20–5.80)
RDW: 12.5 % (ref 11.0–15.0)
Total Lymphocyte: 39.8 %
WBC: 4 10*3/uL (ref 3.8–10.8)

## 2018-11-02 LAB — CK: Total CK: 217 U/L — ABNORMAL HIGH (ref 44–196)

## 2018-11-02 LAB — URINALYSIS
Bilirubin Urine: NEGATIVE
Glucose, UA: NEGATIVE
Hgb urine dipstick: NEGATIVE
Ketones, ur: NEGATIVE
Leukocytes,Ua: NEGATIVE
Nitrite: NEGATIVE
Protein, ur: NEGATIVE
Specific Gravity, Urine: 1.005 (ref 1.001–1.03)
pH: 7 (ref 5.0–8.0)

## 2018-11-02 LAB — COMPREHENSIVE METABOLIC PANEL
AG Ratio: 1.6 (calc) (ref 1.0–2.5)
ALT: 15 U/L (ref 9–46)
AST: 17 U/L (ref 10–40)
Albumin: 4.4 g/dL (ref 3.6–5.1)
Alkaline phosphatase (APISO): 49 U/L (ref 36–130)
BUN: 10 mg/dL (ref 7–25)
CO2: 27 mmol/L (ref 20–32)
Calcium: 9.6 mg/dL (ref 8.6–10.3)
Chloride: 102 mmol/L (ref 98–110)
Creat: 1.24 mg/dL (ref 0.60–1.35)
Globulin: 2.7 g/dL (calc) (ref 1.9–3.7)
Glucose, Bld: 83 mg/dL (ref 65–99)
Potassium: 4.5 mmol/L (ref 3.5–5.3)
Sodium: 138 mmol/L (ref 135–146)
Total Bilirubin: 0.5 mg/dL (ref 0.2–1.2)
Total Protein: 7.1 g/dL (ref 6.1–8.1)

## 2018-11-02 LAB — HEPATITIS C ANTIBODY
Hepatitis C Ab: NONREACTIVE
SIGNAL TO CUT-OFF: 0.14 (ref <1.00)

## 2018-11-02 LAB — LIPID PANEL
Cholesterol: 153 mg/dL (ref <200)
HDL: 48 mg/dL (ref >=40)
LDL Cholesterol (Calc): 90 mg/dL (calc)
Non-HDL Cholesterol (Calc): 105 mg/dL (calc) (ref <130)
Total CHOL/HDL Ratio: 3.2 (calc) (ref <5.0)
Triglycerides: 65 mg/dL (ref <150)

## 2018-11-02 LAB — HIV ANTIBODY (ROUTINE TESTING W REFLEX): HIV 1&2 Ab, 4th Generation: NONREACTIVE

## 2018-11-02 LAB — LIPASE: Lipase: 9 U/L (ref 7–60)

## 2018-11-02 LAB — C. TRACHOMATIS/N. GONORRHOEAE RNA
C. trachomatis RNA, TMA: NOT DETECTED
N. gonorrhoeae RNA, TMA: NOT DETECTED

## 2018-11-02 LAB — RPR: RPR Ser Ql: NONREACTIVE

## 2018-11-02 LAB — PHOSPHORUS: Phosphorus: 3 mg/dL (ref 2.5–4.5)

## 2018-11-02 LAB — AMYLASE: Amylase: 38 U/L (ref 21–101)

## 2018-11-02 NOTE — Research (Signed)
Participant here for study [id number]. He has no new complaints or concerns other than increased CES-D 10 score of 14 which he attributes to his partner having depression, decreased socialization due to COVID, his living situation changing as they are losing a roommate, the increased news of police on black brutality in the news and his increased work hours. 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 He denies thoughts of self harm and he seems to have healthy coping mechanisms such as taking a break to do things he enjoys like playing a video game or going out for some exercise. He states he is doing well with taking his study IP daily. Today's rapid HIV non-reactive. He received 90 days of oral IP and IM injection of cabotegravir/placebo. He is scheduled for a return visit on 6/23.

## 2018-11-03 LAB — CT/NG RNA, TMA RECTAL
Chlamydia Trachomatis RNA: NOT DETECTED
Neisseria Gonorrhoeae RNA: NOT DETECTED

## 2018-11-29 ENCOUNTER — Encounter (INDEPENDENT_AMBULATORY_CARE_PROVIDER_SITE_OTHER): Payer: Self-pay

## 2018-11-29 ENCOUNTER — Other Ambulatory Visit: Payer: Self-pay

## 2018-11-29 VITALS — BP 105/72 | HR 75 | Temp 98.4°F | Wt 163.5 lb

## 2018-11-29 DIAGNOSIS — Z006 Encounter for examination for normal comparison and control in clinical research program: Secondary | ICD-10-CM

## 2018-11-30 LAB — CBC WITH DIFFERENTIAL/PLATELET
Absolute Monocytes: 332 cells/uL (ref 200–950)
Basophils Absolute: 21 cells/uL (ref 0–200)
Basophils Relative: 0.5 %
Eosinophils Absolute: 90 cells/uL (ref 15–500)
Eosinophils Relative: 2.2 %
HCT: 41.8 % (ref 38.5–50.0)
Hemoglobin: 14.3 g/dL (ref 13.2–17.1)
Lymphs Abs: 1841 cells/uL (ref 850–3900)
MCH: 29.5 pg (ref 27.0–33.0)
MCHC: 34.2 g/dL (ref 32.0–36.0)
MCV: 86.2 fL (ref 80.0–100.0)
MPV: 10.3 fL (ref 7.5–12.5)
Monocytes Relative: 8.1 %
Neutro Abs: 1816 cells/uL (ref 1500–7800)
Neutrophils Relative %: 44.3 %
Platelets: 293 10*3/uL (ref 140–400)
RBC: 4.85 10*6/uL (ref 4.20–5.80)
RDW: 12.7 % (ref 11.0–15.0)
Total Lymphocyte: 44.9 %
WBC: 4.1 10*3/uL (ref 3.8–10.8)

## 2018-11-30 LAB — COMPREHENSIVE METABOLIC PANEL
AG Ratio: 1.7 (calc) (ref 1.0–2.5)
ALT: 16 U/L (ref 9–46)
AST: 17 U/L (ref 10–40)
Albumin: 4.3 g/dL (ref 3.6–5.1)
Alkaline phosphatase (APISO): 48 U/L (ref 36–130)
BUN: 8 mg/dL (ref 7–25)
CO2: 28 mmol/L (ref 20–32)
Calcium: 9.5 mg/dL (ref 8.6–10.3)
Chloride: 104 mmol/L (ref 98–110)
Creat: 1.05 mg/dL (ref 0.60–1.35)
Globulin: 2.6 g/dL (calc) (ref 1.9–3.7)
Glucose, Bld: 85 mg/dL (ref 65–99)
Potassium: 4.5 mmol/L (ref 3.5–5.3)
Sodium: 138 mmol/L (ref 135–146)
Total Bilirubin: 0.9 mg/dL (ref 0.2–1.2)
Total Protein: 6.9 g/dL (ref 6.1–8.1)

## 2018-11-30 LAB — PHOSPHORUS: Phosphorus: 3 mg/dL (ref 2.5–4.5)

## 2018-11-30 LAB — LIPASE: Lipase: 14 U/L (ref 7–60)

## 2018-11-30 LAB — CK: Total CK: 226 U/L — ABNORMAL HIGH (ref 44–196)

## 2018-11-30 LAB — AMYLASE: Amylase: 43 U/L (ref 21–101)

## 2018-11-30 LAB — HIV ANTIBODY (ROUTINE TESTING W REFLEX): HIV 1&2 Ab, 4th Generation: NONREACTIVE

## 2018-11-30 NOTE — Research (Signed)
Participant here for [id number] study. He has no new complaints or concerns. He had no issues with last injection. Today's rapid HIV result non-reactive. He is scheduled for his next visit on 01/03/19.

## 2019-01-03 ENCOUNTER — Other Ambulatory Visit: Payer: Self-pay

## 2019-01-03 ENCOUNTER — Encounter (INDEPENDENT_AMBULATORY_CARE_PROVIDER_SITE_OTHER): Payer: Self-pay | Admitting: *Deleted

## 2019-01-03 VITALS — BP 105/73 | HR 76 | Temp 97.9°F | Wt 163.5 lb

## 2019-01-03 DIAGNOSIS — Z006 Encounter for examination for normal comparison and control in clinical research program: Secondary | ICD-10-CM

## 2019-01-03 NOTE — Research (Signed)
Adam Murphy is here for his week 113 [id number] visit. No new complaints or medications. He was unblinded to study product and is on cabotegravir. Rapid HIV non-reactive. Cabotegravir injection given (R) glute without problem. He will return in September for his next study visit.

## 2019-01-04 LAB — CBC WITH DIFFERENTIAL/PLATELET
Absolute Monocytes: 336 cells/uL (ref 200–950)
Basophils Absolute: 21 cells/uL (ref 0–200)
Basophils Relative: 0.5 %
Eosinophils Absolute: 113 cells/uL (ref 15–500)
Eosinophils Relative: 2.7 %
HCT: 42.2 % (ref 38.5–50.0)
Hemoglobin: 14.5 g/dL (ref 13.2–17.1)
Lymphs Abs: 1680 cells/uL (ref 850–3900)
MCH: 29.5 pg (ref 27.0–33.0)
MCHC: 34.4 g/dL (ref 32.0–36.0)
MCV: 85.8 fL (ref 80.0–100.0)
MPV: 10.2 fL (ref 7.5–12.5)
Monocytes Relative: 8 %
Neutro Abs: 2050 cells/uL (ref 1500–7800)
Neutrophils Relative %: 48.8 %
Platelets: 295 10*3/uL (ref 140–400)
RBC: 4.92 10*6/uL (ref 4.20–5.80)
RDW: 12.6 % (ref 11.0–15.0)
Total Lymphocyte: 40 %
WBC: 4.2 10*3/uL (ref 3.8–10.8)

## 2019-01-04 LAB — HIV ANTIBODY (ROUTINE TESTING W REFLEX): HIV 1&2 Ab, 4th Generation: NONREACTIVE

## 2019-01-04 LAB — COMPREHENSIVE METABOLIC PANEL
AG Ratio: 1.6 (calc) (ref 1.0–2.5)
ALT: 15 U/L (ref 9–46)
AST: 16 U/L (ref 10–40)
Albumin: 4.6 g/dL (ref 3.6–5.1)
Alkaline phosphatase (APISO): 49 U/L (ref 36–130)
BUN: 11 mg/dL (ref 7–25)
CO2: 26 mmol/L (ref 20–32)
Calcium: 9.6 mg/dL (ref 8.6–10.3)
Chloride: 102 mmol/L (ref 98–110)
Creat: 1.2 mg/dL (ref 0.60–1.35)
Globulin: 2.9 g/dL (calc) (ref 1.9–3.7)
Glucose, Bld: 85 mg/dL (ref 65–99)
Potassium: 4.3 mmol/L (ref 3.5–5.3)
Sodium: 138 mmol/L (ref 135–146)
Total Bilirubin: 0.7 mg/dL (ref 0.2–1.2)
Total Protein: 7.5 g/dL (ref 6.1–8.1)

## 2019-01-04 LAB — CK: Total CK: 177 U/L (ref 44–196)

## 2019-01-04 LAB — AMYLASE: Amylase: 42 U/L (ref 21–101)

## 2019-01-04 LAB — LIPASE: Lipase: 12 U/L (ref 7–60)

## 2019-01-04 LAB — PHOSPHORUS: Phosphorus: 2.9 mg/dL (ref 2.5–4.5)

## 2019-02-28 ENCOUNTER — Other Ambulatory Visit: Payer: Self-pay

## 2019-02-28 ENCOUNTER — Encounter (INDEPENDENT_AMBULATORY_CARE_PROVIDER_SITE_OTHER): Payer: Managed Care, Other (non HMO) | Admitting: *Deleted

## 2019-02-28 VITALS — BP 106/77 | HR 73 | Temp 99.0°F | Wt 160.0 lb

## 2019-02-28 DIAGNOSIS — Z006 Encounter for examination for normal comparison and control in clinical research program: Secondary | ICD-10-CM

## 2019-02-28 NOTE — Research (Signed)
Adam Murphy was here for his week 121 visit for [place]. He says he has been a little down about everything, (work, pandemic, Lexicographer, etc.) but does not need counseling now. [facility] nongonococcal urethritis he said in August. I will contact the [facility] to get records. An injection was given in his left hip without problem. He will be returning in November for the next visit.

## 2019-03-01 LAB — COMPREHENSIVE METABOLIC PANEL
AG Ratio: 1.6 (calc) (ref 1.0–2.5)
ALT: 13 U/L (ref 9–46)
AST: 17 U/L (ref 10–40)
Albumin: 4.4 g/dL (ref 3.6–5.1)
Alkaline phosphatase (APISO): 46 U/L (ref 36–130)
BUN: 12 mg/dL (ref 7–25)
CO2: 26 mmol/L (ref 20–32)
Calcium: 9.6 mg/dL (ref 8.6–10.3)
Chloride: 104 mmol/L (ref 98–110)
Creat: 1.23 mg/dL (ref 0.60–1.35)
Globulin: 2.7 g/dL (calc) (ref 1.9–3.7)
Glucose, Bld: 84 mg/dL (ref 65–99)
Potassium: 4.6 mmol/L (ref 3.5–5.3)
Sodium: 139 mmol/L (ref 135–146)
Total Bilirubin: 0.5 mg/dL (ref 0.2–1.2)
Total Protein: 7.1 g/dL (ref 6.1–8.1)

## 2019-03-01 LAB — CBC WITH DIFFERENTIAL/PLATELET
Absolute Monocytes: 332 cells/uL (ref 200–950)
Basophils Absolute: 20 cells/uL (ref 0–200)
Basophils Relative: 0.5 %
Eosinophils Absolute: 82 cells/uL (ref 15–500)
Eosinophils Relative: 2.1 %
HCT: 42.3 % (ref 38.5–50.0)
Hemoglobin: 14.7 g/dL (ref 13.2–17.1)
Lymphs Abs: 1724 cells/uL (ref 850–3900)
MCH: 30 pg (ref 27.0–33.0)
MCHC: 34.8 g/dL (ref 32.0–36.0)
MCV: 86.3 fL (ref 80.0–100.0)
MPV: 10.5 fL (ref 7.5–12.5)
Monocytes Relative: 8.5 %
Neutro Abs: 1743 cells/uL (ref 1500–7800)
Neutrophils Relative %: 44.7 %
Platelets: 300 10*3/uL (ref 140–400)
RBC: 4.9 10*6/uL (ref 4.20–5.80)
RDW: 12.7 % (ref 11.0–15.0)
Total Lymphocyte: 44.2 %
WBC: 3.9 10*3/uL (ref 3.8–10.8)

## 2019-03-01 LAB — AMYLASE: Amylase: 41 U/L (ref 21–101)

## 2019-03-01 LAB — CK: Total CK: 226 U/L — ABNORMAL HIGH (ref 44–196)

## 2019-03-01 LAB — HIV ANTIBODY (ROUTINE TESTING W REFLEX): HIV 1&2 Ab, 4th Generation: NONREACTIVE

## 2019-03-01 LAB — LIPASE: Lipase: 13 U/L (ref 7–60)

## 2019-03-01 LAB — PHOSPHORUS: Phosphorus: 3.7 mg/dL (ref 2.5–4.5)

## 2019-04-25 ENCOUNTER — Encounter (INDEPENDENT_AMBULATORY_CARE_PROVIDER_SITE_OTHER): Payer: Managed Care, Other (non HMO)

## 2019-04-25 ENCOUNTER — Other Ambulatory Visit: Payer: Self-pay

## 2019-04-25 VITALS — BP 114/76 | HR 76 | Temp 97.6°F | Wt 169.0 lb

## 2019-04-25 DIAGNOSIS — Z006 Encounter for examination for normal comparison and control in clinical research program: Secondary | ICD-10-CM

## 2019-04-25 NOTE — Research (Signed)
Participant here for study [id number]. He has no new concerns or complaints. He recently had to retake his Hep B vaccine series and has had the first injection about 1 month ago. He also states he updated his Tdap and had his influenza vaccine. Today's rapid HIV is non-reactive. He received an injection of cabotegravir LA IM. He is scheduled for his next study visit in January.

## 2019-04-26 LAB — CBC WITH DIFFERENTIAL/PLATELET
Absolute Monocytes: 311 cells/uL (ref 200–950)
Basophils Absolute: 19 cells/uL (ref 0–200)
Basophils Relative: 0.5 %
Eosinophils Absolute: 81 cells/uL (ref 15–500)
Eosinophils Relative: 2.2 %
HCT: 42.8 % (ref 38.5–50.0)
Hemoglobin: 14.6 g/dL (ref 13.2–17.1)
Lymphs Abs: 1610 cells/uL (ref 850–3900)
MCH: 29 pg (ref 27.0–33.0)
MCHC: 34.1 g/dL (ref 32.0–36.0)
MCV: 84.9 fL (ref 80.0–100.0)
MPV: 10.4 fL (ref 7.5–12.5)
Monocytes Relative: 8.4 %
Neutro Abs: 1680 cells/uL (ref 1500–7800)
Neutrophils Relative %: 45.4 %
Platelets: 292 10*3/uL (ref 140–400)
RBC: 5.04 10*6/uL (ref 4.20–5.80)
RDW: 12.6 % (ref 11.0–15.0)
Total Lymphocyte: 43.5 %
WBC: 3.7 10*3/uL — ABNORMAL LOW (ref 3.8–10.8)

## 2019-04-26 LAB — COMPREHENSIVE METABOLIC PANEL
AG Ratio: 1.7 (calc) (ref 1.0–2.5)
ALT: 17 U/L (ref 9–46)
AST: 16 U/L (ref 10–40)
Albumin: 4.7 g/dL (ref 3.6–5.1)
Alkaline phosphatase (APISO): 49 U/L (ref 36–130)
BUN: 9 mg/dL (ref 7–25)
CO2: 27 mmol/L (ref 20–32)
Calcium: 9.7 mg/dL (ref 8.6–10.3)
Chloride: 104 mmol/L (ref 98–110)
Creat: 1.19 mg/dL (ref 0.60–1.35)
Globulin: 2.8 g/dL (calc) (ref 1.9–3.7)
Glucose, Bld: 90 mg/dL (ref 65–99)
Potassium: 4.5 mmol/L (ref 3.5–5.3)
Sodium: 139 mmol/L (ref 135–146)
Total Bilirubin: 0.6 mg/dL (ref 0.2–1.2)
Total Protein: 7.5 g/dL (ref 6.1–8.1)

## 2019-04-26 LAB — CK: Total CK: 260 U/L — ABNORMAL HIGH (ref 44–196)

## 2019-04-26 LAB — AMYLASE: Amylase: 43 U/L (ref 21–101)

## 2019-04-26 LAB — RPR: RPR Ser Ql: NONREACTIVE

## 2019-04-26 LAB — HIV ANTIBODY (ROUTINE TESTING W REFLEX): HIV 1&2 Ab, 4th Generation: NONREACTIVE

## 2019-04-26 LAB — LIPASE: Lipase: 11 U/L (ref 7–60)

## 2019-04-26 LAB — PHOSPHORUS: Phosphorus: 3.1 mg/dL (ref 2.5–4.5)

## 2019-04-26 LAB — C. TRACHOMATIS/N. GONORRHOEAE RNA
C. trachomatis RNA, TMA: NOT DETECTED
N. gonorrhoeae RNA, TMA: NOT DETECTED

## 2019-04-27 LAB — CT/NG RNA, TMA RECTAL
Chlamydia Trachomatis RNA: NOT DETECTED
Neisseria Gonorrhoeae RNA: NOT DETECTED

## 2019-06-20 ENCOUNTER — Encounter (INDEPENDENT_AMBULATORY_CARE_PROVIDER_SITE_OTHER): Payer: Self-pay | Admitting: *Deleted

## 2019-06-20 ENCOUNTER — Other Ambulatory Visit: Payer: Self-pay

## 2019-06-20 VITALS — BP 109/77 | HR 69 | Temp 98.0°F | Wt 166.5 lb

## 2019-06-20 DIAGNOSIS — Z006 Encounter for examination for normal comparison and control in clinical research program: Secondary | ICD-10-CM

## 2019-06-20 NOTE — Research (Signed)
Adam Murphy is here for his week 137 HPTN 083 visit. States that he had mild tenderness and swelling at the injection site after his last injection which lasted about a week. No other complaints. Rapid HIV non-reactive. Cabotegravir injection given (L) glute without problem. He will return in March for his next study visit.

## 2019-06-21 LAB — COMPREHENSIVE METABOLIC PANEL
AG Ratio: 1.5 (calc) (ref 1.0–2.5)
ALT: 22 U/L (ref 9–46)
AST: 19 U/L (ref 10–40)
Albumin: 4.6 g/dL (ref 3.6–5.1)
Alkaline phosphatase (APISO): 51 U/L (ref 36–130)
BUN: 10 mg/dL (ref 7–25)
CO2: 29 mmol/L (ref 20–32)
Calcium: 9.7 mg/dL (ref 8.6–10.3)
Chloride: 103 mmol/L (ref 98–110)
Creat: 1.19 mg/dL (ref 0.60–1.35)
Globulin: 3 g/dL (calc) (ref 1.9–3.7)
Glucose, Bld: 86 mg/dL (ref 65–99)
Potassium: 4.3 mmol/L (ref 3.5–5.3)
Sodium: 140 mmol/L (ref 135–146)
Total Bilirubin: 0.7 mg/dL (ref 0.2–1.2)
Total Protein: 7.6 g/dL (ref 6.1–8.1)

## 2019-06-21 LAB — CK: Total CK: 367 U/L — ABNORMAL HIGH (ref 44–196)

## 2019-06-21 LAB — CBC WITH DIFFERENTIAL/PLATELET
Absolute Monocytes: 338 cells/uL (ref 200–950)
Basophils Absolute: 19 cells/uL (ref 0–200)
Basophils Relative: 0.5 %
Eosinophils Absolute: 91 cells/uL (ref 15–500)
Eosinophils Relative: 2.4 %
HCT: 43.8 % (ref 38.5–50.0)
Hemoglobin: 15.1 g/dL (ref 13.2–17.1)
Lymphs Abs: 1809 cells/uL (ref 850–3900)
MCH: 29.2 pg (ref 27.0–33.0)
MCHC: 34.5 g/dL (ref 32.0–36.0)
MCV: 84.6 fL (ref 80.0–100.0)
MPV: 10.2 fL (ref 7.5–12.5)
Monocytes Relative: 8.9 %
Neutro Abs: 1543 cells/uL (ref 1500–7800)
Neutrophils Relative %: 40.6 %
Platelets: 304 10*3/uL (ref 140–400)
RBC: 5.18 10*6/uL (ref 4.20–5.80)
RDW: 12.6 % (ref 11.0–15.0)
Total Lymphocyte: 47.6 %
WBC: 3.8 10*3/uL (ref 3.8–10.8)

## 2019-06-21 LAB — PHOSPHORUS: Phosphorus: 3.3 mg/dL (ref 2.5–4.5)

## 2019-06-21 LAB — HIV ANTIBODY (ROUTINE TESTING W REFLEX): HIV 1&2 Ab, 4th Generation: NONREACTIVE

## 2019-06-21 LAB — AMYLASE: Amylase: 50 U/L (ref 21–101)

## 2019-06-21 LAB — LIPASE: Lipase: 16 U/L (ref 7–60)

## 2019-08-22 ENCOUNTER — Encounter (INDEPENDENT_AMBULATORY_CARE_PROVIDER_SITE_OTHER): Payer: Self-pay | Admitting: *Deleted

## 2019-08-22 ENCOUNTER — Other Ambulatory Visit: Payer: Self-pay

## 2019-08-22 VITALS — BP 108/74 | HR 67 | Temp 97.9°F | Wt 168.4 lb

## 2019-08-22 DIAGNOSIS — Z006 Encounter for examination for normal comparison and control in clinical research program: Secondary | ICD-10-CM

## 2019-08-22 NOTE — Research (Signed)
Adam Murphy is here for his week 145 HPTN 083 visit. States that he had 3 days of mild tenderness and swelling at the injection site after his last injection. No other complaints verbalized. His rapid HIV was non-reactive. He received cabotegravir injection (R) glute without problem. He will return in May for his next study visit.

## 2019-08-23 LAB — COMPREHENSIVE METABOLIC PANEL
AG Ratio: 1.4 (calc) (ref 1.0–2.5)
ALT: 19 U/L (ref 9–46)
AST: 19 U/L (ref 10–40)
Albumin: 4.3 g/dL (ref 3.6–5.1)
Alkaline phosphatase (APISO): 50 U/L (ref 36–130)
BUN/Creatinine Ratio: 5 (calc) — ABNORMAL LOW (ref 6–22)
BUN: 6 mg/dL — ABNORMAL LOW (ref 7–25)
CO2: 29 mmol/L (ref 20–32)
Calcium: 9.6 mg/dL (ref 8.6–10.3)
Chloride: 103 mmol/L (ref 98–110)
Creat: 1.2 mg/dL (ref 0.60–1.35)
Globulin: 3 g/dL (calc) (ref 1.9–3.7)
Glucose, Bld: 92 mg/dL (ref 65–99)
Potassium: 4.1 mmol/L (ref 3.5–5.3)
Sodium: 140 mmol/L (ref 135–146)
Total Bilirubin: 1 mg/dL (ref 0.2–1.2)
Total Protein: 7.3 g/dL (ref 6.1–8.1)

## 2019-08-23 LAB — CBC WITH DIFFERENTIAL/PLATELET
Absolute Monocytes: 378 cells/uL (ref 200–950)
Basophils Absolute: 22 cells/uL (ref 0–200)
Basophils Relative: 0.5 %
Eosinophils Absolute: 82 cells/uL (ref 15–500)
Eosinophils Relative: 1.9 %
HCT: 45.7 % (ref 38.5–50.0)
Hemoglobin: 15.3 g/dL (ref 13.2–17.1)
Lymphs Abs: 1840 cells/uL (ref 850–3900)
MCH: 28.9 pg (ref 27.0–33.0)
MCHC: 33.5 g/dL (ref 32.0–36.0)
MCV: 86.4 fL (ref 80.0–100.0)
MPV: 10.4 fL (ref 7.5–12.5)
Monocytes Relative: 8.8 %
Neutro Abs: 1978 cells/uL (ref 1500–7800)
Neutrophils Relative %: 46 %
Platelets: 293 10*3/uL (ref 140–400)
RBC: 5.29 10*6/uL (ref 4.20–5.80)
RDW: 12.9 % (ref 11.0–15.0)
Total Lymphocyte: 42.8 %
WBC: 4.3 10*3/uL (ref 3.8–10.8)

## 2019-08-23 LAB — CK: Total CK: 279 U/L — ABNORMAL HIGH (ref 44–196)

## 2019-08-23 LAB — PHOSPHORUS: Phosphorus: 3.2 mg/dL (ref 2.5–4.5)

## 2019-08-23 LAB — HIV ANTIBODY (ROUTINE TESTING W REFLEX): HIV 1&2 Ab, 4th Generation: NONREACTIVE

## 2019-08-23 LAB — AMYLASE: Amylase: 37 U/L (ref 21–101)

## 2019-08-23 LAB — LIPASE: Lipase: 13 U/L (ref 7–60)

## 2019-10-17 ENCOUNTER — Encounter (INDEPENDENT_AMBULATORY_CARE_PROVIDER_SITE_OTHER): Payer: Self-pay | Admitting: *Deleted

## 2019-10-17 ENCOUNTER — Other Ambulatory Visit: Payer: Self-pay

## 2019-10-17 VITALS — BP 106/72 | HR 73 | Temp 98.0°F | Wt 169.0 lb

## 2019-10-17 DIAGNOSIS — Z006 Encounter for examination for normal comparison and control in clinical research program: Secondary | ICD-10-CM

## 2019-10-17 MED ORDER — STUDY - HPTN 083 (STEP 3, OPEN-LABEL) - EMTRICITABINE/TENOFOVIR DISOPROXIL FUMARATE (TRUVADA) 200-300MG TABLET (PI-VAN DAM): 1.0000 | ORAL_TABLET | Freq: Every day | ORAL | Status: AC

## 2019-10-17 NOTE — Research (Signed)
Adam Murphy is here for his week 153 HPTN 083 visit. He moved to step 3 and will be receiving open-label truvada. He is interested in switching back to the injectable cabotegravir once version 4.0 of the protocol is approved.  He says he had mild tenderness at the injection site after his last visit. He did take tylenol and applied warm moist heat to the area. Tenderness last about 3 days. No other complaints or concerns. He did receive his last Hep B vaccine last month and has received both doses of his Covid vaccine. He will return in August for his next study visit.

## 2019-10-18 LAB — COMPREHENSIVE METABOLIC PANEL
AG Ratio: 1.7 (calc) (ref 1.0–2.5)
ALT: 16 U/L (ref 9–46)
AST: 15 U/L (ref 10–40)
Albumin: 4.7 g/dL (ref 3.6–5.1)
Alkaline phosphatase (APISO): 57 U/L (ref 36–130)
BUN: 10 mg/dL (ref 7–25)
CO2: 27 mmol/L (ref 20–32)
Calcium: 9.8 mg/dL (ref 8.6–10.3)
Chloride: 103 mmol/L (ref 98–110)
Creat: 1.16 mg/dL (ref 0.60–1.35)
Globulin: 2.8 g/dL (calc) (ref 1.9–3.7)
Glucose, Bld: 86 mg/dL (ref 65–99)
Potassium: 4.5 mmol/L (ref 3.5–5.3)
Sodium: 139 mmol/L (ref 135–146)
Total Bilirubin: 0.4 mg/dL (ref 0.2–1.2)
Total Protein: 7.5 g/dL (ref 6.1–8.1)

## 2019-10-18 LAB — CBC WITH DIFFERENTIAL/PLATELET
Absolute Monocytes: 277 cells/uL (ref 200–950)
Basophils Absolute: 19 cells/uL (ref 0–200)
Basophils Relative: 0.5 %
Eosinophils Absolute: 209 cells/uL (ref 15–500)
Eosinophils Relative: 5.5 %
HCT: 44 % (ref 38.5–50.0)
Hemoglobin: 15 g/dL (ref 13.2–17.1)
Lymphs Abs: 1573 cells/uL (ref 850–3900)
MCH: 29.5 pg (ref 27.0–33.0)
MCHC: 34.1 g/dL (ref 32.0–36.0)
MCV: 86.4 fL (ref 80.0–100.0)
MPV: 10.4 fL (ref 7.5–12.5)
Monocytes Relative: 7.3 %
Neutro Abs: 1721 cells/uL (ref 1500–7800)
Neutrophils Relative %: 45.3 %
Platelets: 298 10*3/uL (ref 140–400)
RBC: 5.09 10*6/uL (ref 4.20–5.80)
RDW: 12.5 % (ref 11.0–15.0)
Total Lymphocyte: 41.4 %
WBC: 3.8 10*3/uL (ref 3.8–10.8)

## 2019-10-18 LAB — HEPATITIS C ANTIBODY
Hepatitis C Ab: NONREACTIVE
SIGNAL TO CUT-OFF: 0.04 (ref <1.00)

## 2019-10-18 LAB — AMYLASE: Amylase: 45 U/L (ref 21–101)

## 2019-10-18 LAB — LIPASE: Lipase: 16 U/L (ref 7–60)

## 2019-10-18 LAB — PHOSPHORUS: Phosphorus: 3.5 mg/dL (ref 2.5–4.5)

## 2019-10-18 LAB — CK: Total CK: 135 U/L (ref 44–196)

## 2019-10-18 LAB — HIV ANTIBODY (ROUTINE TESTING W REFLEX): HIV 1&2 Ab, 4th Generation: NONREACTIVE

## 2019-10-18 LAB — RPR: RPR Ser Ql: NONREACTIVE

## 2019-10-22 LAB — URINALYSIS, ROUTINE W REFLEX MICROSCOPIC
Bilirubin Urine: NEGATIVE
Glucose, UA: NEGATIVE
Hgb urine dipstick: NEGATIVE
Ketones, ur: NEGATIVE
Leukocytes,Ua: NEGATIVE
Nitrite: NEGATIVE
Protein, ur: NEGATIVE
Specific Gravity, Urine: 1.005 (ref 1.001–1.03)
pH: 7 (ref 5.0–8.0)

## 2019-10-22 LAB — CT/NG RNA, TMA RECTAL
Chlamydia Trachomatis RNA: NOT DETECTED
Neisseria Gonorrhoeae RNA: NOT DETECTED

## 2019-10-22 LAB — C. TRACHOMATIS/N. GONORRHOEAE RNA
C. trachomatis RNA, TMA: NOT DETECTED
N. gonorrhoeae RNA, TMA: NOT DETECTED

## 2019-12-26 ENCOUNTER — Encounter (INDEPENDENT_AMBULATORY_CARE_PROVIDER_SITE_OTHER): Payer: Self-pay | Admitting: *Deleted

## 2019-12-26 ENCOUNTER — Other Ambulatory Visit: Payer: Self-pay

## 2019-12-26 VITALS — BP 105/70 | HR 68 | Temp 98.1°F | Wt 167.0 lb

## 2019-12-26 DIAGNOSIS — Z006 Encounter for examination for normal comparison and control in clinical research program: Secondary | ICD-10-CM

## 2019-12-26 LAB — TIQ-MISC

## 2019-12-26 NOTE — Research (Signed)
Adam Murphy was here for his HPTN 083 step 5, day 0 visit. At his last visit he started the truvada and since he is planning on moving soon to [place] state, he prefers to stay on the Truvada until he can either transfer to another research site there or find a PREP provider. He said he had taken omeprazole over the past 2 weeks for some GI sx, which he thought were from the truvada, He says that has cleared up now. We did get an EKG that was missed at the last visit and I had Dr. Daiva Eves review.  I will reach out to the HPTN team and see if there are any research sites near where he will be living at.

## 2019-12-29 LAB — C. TRACHOMATIS/N. GONORRHOEAE RNA

## 2019-12-31 LAB — C. TRACHOMATIS/N. GONORRHOEAE RNA
C. trachomatis RNA, TMA: NOT DETECTED
N. gonorrhoeae RNA, TMA: NOT DETECTED

## 2019-12-31 LAB — CT/NG RNA, TMA RECTAL
Chlamydia Trachomatis RNA: NOT DETECTED
Neisseria Gonorrhoeae RNA: NOT DETECTED

## 2020-01-05 LAB — HEPATIC FUNCTION PANEL
AG Ratio: 1.6 (calc) (ref 1.0–2.5)
ALT: 19 U/L (ref 9–46)
AST: 17 U/L (ref 10–40)
Albumin: 4.6 g/dL (ref 3.6–5.1)
Alkaline phosphatase (APISO): 56 U/L (ref 36–130)
Bilirubin, Direct: 0.1 mg/dL (ref 0.0–0.2)
Globulin: 2.8 g/dL (calc) (ref 1.9–3.7)
Indirect Bilirubin: 0.3 mg/dL (calc) (ref 0.2–1.2)
Total Bilirubin: 0.4 mg/dL (ref 0.2–1.2)
Total Protein: 7.4 g/dL (ref 6.1–8.1)

## 2020-01-05 LAB — HIV-1 RNA QUANT-NO REFLEX-BLD
HIV 1 RNA Quant: <20 copies/mL
HIV-1 RNA Quant, Log: <1.3 Log copies/mL

## 2020-01-05 LAB — RPR: RPR Ser Ql: NONREACTIVE

## 2020-01-05 LAB — HIV ANTIBODY (ROUTINE TESTING W REFLEX): HIV 1&2 Ab, 4th Generation: NONREACTIVE

## 2020-01-05 LAB — CREATININE, SERUM: Creat: 1.21 mg/dL (ref 0.60–1.35)
